# Patient Record
Sex: Male | Born: 1995 | Race: White | Hispanic: No | Marital: Single | Smoking: Never smoker
Health system: Southern US, Community
[De-identification: ages and names within clinical notes are randomized; demographics above are authoritative.]

## PROBLEM LIST (undated history)

## (undated) DIAGNOSIS — T7840XA Allergy, unspecified, initial encounter: Secondary | ICD-10-CM

## (undated) HISTORY — PX: CORONARY ARTERY BYPASS GRAFT: SHX141

## (undated) HISTORY — DX: Allergy, unspecified, initial encounter: T78.40XA

---
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
## 2017-03-23 ENCOUNTER — Telehealth: Payer: Self-pay | Admitting: Medical

## 2017-03-23 NOTE — Telephone Encounter (Signed)
Pt's mother dropped off immunization form for pt, document was given to [facility](Jasmine) pt is needing form by tomorrow

## 2017-04-01 ENCOUNTER — Telehealth: Payer: Self-pay | Admitting: Medical

## 2017-04-01 NOTE — Telephone Encounter (Signed)
Pt mother brought in document to be filled out for school, pt's mother was informed to pick up document today at 4 or 4:30. Document given to Jazmine.

## 2017-04-01 NOTE — Telephone Encounter (Signed)
Called patient to discuss.  Pt stated that he has a copy of the paperwork and could drop it off today around 2:45pm.  He said he would schedule his physical exam whenever he drops off paperwork.  Pt stated he could not talk longer, that he had to get back to work.  No additional needs or concerns voiced at this time.    2:45p time slot blocked to allot time to complete paperwork.

## 2017-04-01 NOTE — Telephone Encounter (Signed)
I last saw form when I gave it to you asking when did  I last see pt. I remember it seemed quite a long time and I did not remember seeing him any time recently.. Note in epic states we got t on 03-23-2017. And note states they needed it by 03-24-2017??  I likely gave you the form since I had just got back from vacation and was very busy not having time to fill out form but wanting more info. I have not seen form this week. So if we can't find ask pt or mom did they make copy. If they need form filled out today. Have them bring it in by 2:45. Block that slot for me.

## 2017-04-01 NOTE — Telephone Encounter (Signed)
Spoke with pt. Pt states he will not be able to get off work in time to pick up paper. Pt scheduled appointment for Monday.

## 2017-04-01 NOTE — Telephone Encounter (Signed)
Looking at his form he needs tdap booster and mmr titer. Looks like 2nd mmr never done. He stated in prior visit all vaccines done in state of [place]. Maybe someone did not put vaccine in data base. Titer option to prove he had two vaccines and immunity achieved.  Recommended meningococal conjugage vaccine.  Also he can get ppd or tb gold test.  Call pt and see if he can get here by 4:30

## 2017-04-01 NOTE — Telephone Encounter (Signed)
Pt's mom called in to follow up on paperwork. CMA stated that she will check with PCP and follow back up to assist.

## 2017-04-04 ENCOUNTER — Ambulatory Visit (INDEPENDENT_AMBULATORY_CARE_PROVIDER_SITE_OTHER): Payer: BLUE CROSS/BLUE SHIELD | Admitting: Medical

## 2017-04-04 ENCOUNTER — Encounter: Payer: Self-pay | Admitting: Medical

## 2017-04-04 VITALS — BP 135/80 | HR 68 | Temp 100.0°F | Ht 72.0 in | Wt 297.6 lb

## 2017-04-04 DIAGNOSIS — R7612 Nonspecific reaction to cell mediated immunity measurement of gamma interferon antigen response without active tuberculosis: Secondary | ICD-10-CM | POA: Diagnosis not present

## 2017-04-04 DIAGNOSIS — Z789 Other specified health status: Secondary | ICD-10-CM

## 2017-04-04 DIAGNOSIS — Z23 Encounter for immunization: Secondary | ICD-10-CM

## 2017-04-04 DIAGNOSIS — Z111 Encounter for screening for respiratory tuberculosis: Secondary | ICD-10-CM

## 2017-04-04 NOTE — Progress Notes (Signed)
   Subjective:    Patient ID: Dale Schmidt, male    DOB: 31-Dec-1995, 21 y.o.   MRN: [id number]  HPI  Pt in for forms to be filled out for school.  Pt needs tdap. By vaccine review never got 2nd mmr per state data base. Or maybe got but  never placed in data base. Meningitis vaccine recommended. Also would recommend TB screening test. Either blood or ppd.  Pt going to school mid August.  On review no acute illness and 4-5 years since out of country. Last visited [place] at that time.   Review of Systems  Constitutional: Negative for chills, fatigue and fever.  HENT: Negative for congestion, dental problem, drooling, hearing loss, sinus pain and sinus pressure.   Respiratory: Negative for cough, chest tightness, shortness of breath and wheezing.   Cardiovascular: Negative for chest pain and palpitations.  Gastrointestinal: Negative for abdominal pain, constipation, diarrhea, nausea and vomiting.  Genitourinary: Negative for difficulty urinating, enuresis, flank pain, genital sores, hematuria and scrotal swelling.  Musculoskeletal: Negative for back pain.  Skin: Negative for rash.  Neurological: Negative for dizziness, speech difficulty, weakness and headaches.  Hematological: Negative for adenopathy. Does not bruise/bleed easily.  Psychiatric/Behavioral: Negative for behavioral problems, confusion and suicidal ideas. The patient is not nervous/anxious.    Past Medical History:  Diagnosis Date  . Allergy      Social History   Social History  . Marital status: Single    Spouse name: N/A  . Number of children: N/A  . Years of education: N/A   Occupational History  . Not on file.   Social History Main Topics  . Smoking status: Never Smoker  . Smokeless tobacco: Never Used  . Alcohol use No  . Drug use: No  . Sexual activity: No   Other Topics Concern  . Not on file   Social History Narrative  . No narrative on file    No past surgical history on file.  Family  History  Problem Relation Age of Onset  . Hyperlipidemia Father     No Known Allergies  No current outpatient prescriptions on file prior to visit.   No current facility-administered medications on file prior to visit.     BP 135/80   Pulse 68   Temp 100 F (37.8 C)   Ht 6' (1.829 m)   Wt 297 lb 9.6 oz (135 kg)   BMI 40.36 kg/m       Objective:   Physical Exam  General Mental Status- Alert. General Appearance- Not in acute distress.   Skin General: Color- Normal Color. Moisture- Normal Moisture.  . Chest and Lung Exam Auscultation: Breath Sounds:-Normal.  Cardiovascular Auscultation:Rythm- Regular. Murmurs & Other Heart Sounds:Auscultation of the heart reveals- No Murmurs.  Abdomen Inspection:-Inspeection Normal. Palpation/Percussion:Note:No mass. Palpation and Percussion of the abdomen reveal- Non Tender, Non Distended + BS, no rebound or guarding.   Neurologic Cranial Nerve exam:- CN III-XII intact(No nystagmus), symmetric smile. Strength:- 5/5 equal and symmetric strength both upper and lower extremities.      Assessment & Plan:  For your college form and general health will get tdap vaccine and meningitis vaccine.  Will also get mmr titer and tb blood test.  Will follow lab results and then fill out form. Then advise you to come by and pick up forms.  Follow up as needed  Oluwaseyi Tull, Percell Miller, PA-C

## 2017-04-04 NOTE — Patient Instructions (Addendum)
For your college form and general health will get tdap vaccine and meningitis vaccine.  Will also get mmr titer and and tb blood test  Will follow lab results and then fill out form. Then advise you to come by and pick up forms.  Follow up as needed

## 2017-04-04 NOTE — Addendum Note (Signed)
Addended by: Orlene OchRENCE, Darrel Gloss N on: 04/04/2017 01:39 PM   Modules accepted: Orders

## 2017-04-05 LAB — MEASLES/MUMPS/RUBELLA IMMUNITY
Mumps IgG: 31.2 AU/mL — ABNORMAL HIGH (ref <9.00)
Rubella: 8.88 Index — ABNORMAL HIGH (ref <0.90)
Rubeola IgG: 113 AU/mL — ABNORMAL HIGH (ref <25.00)

## 2017-04-06 LAB — QUANTIFERON TB GOLD ASSAY (BLOOD)
Interferon Gamma Release Assay: NEGATIVE
MITOGEN-NIL SO: 9.41 [IU]/mL
Quantiferon Nil Value: 0.05 IU/mL
Quantiferon Tb Ag Minus Nil Value: <0 IU/mL

## 2017-04-07 ENCOUNTER — Telehealth: Payer: Self-pay | Admitting: Medical

## 2017-04-07 NOTE — Telephone Encounter (Signed)
Filled out form for vaccines. But would you fill out the meningitis vaccine portion and then he can pick up. Will you make copy of form so we can scan.

## 2017-04-11 NOTE — Telephone Encounter (Signed)
done

## 2017-04-18 ENCOUNTER — Telehealth: Payer: Self-pay | Admitting: Medical

## 2017-04-18 NOTE — Telephone Encounter (Signed)
Caller name: Domenic SchwabFaryal  Relation to pt: mother  Call back number: mother Jani Filestel [phone] Pharmacy:  Reason for call: Pt's mother wanting to have information about lab work with titer blood test done, pt is needing [facility] for [facility] [employer]. Pt's mother stated if possible to send a copy of his titer lab work to [facility] fax number [phone]. If pt does not have this lab work done before please let mother know ASAP so that pt can get orders and have it done for school. Please advise ASAP.

## 2017-04-19 ENCOUNTER — Telehealth: Payer: Self-pay | Admitting: Medical

## 2017-04-19 NOTE — Telephone Encounter (Signed)
I filled out pt school physical exam form. You can have mom pick up copy of pt mmr titer results. Or fax over the results if it is ok to do so.

## 2017-04-19 NOTE — Telephone Encounter (Signed)
See mom note sent to us by Royal HawthornJackelyn

## 2017-04-20 NOTE — Telephone Encounter (Signed)
Papers are un front awaiting pick up. Pt's mother was made aware.

## 2017-08-10 ENCOUNTER — Encounter: Payer: Self-pay | Admitting: Medical

## 2017-08-10 ENCOUNTER — Ambulatory Visit (INDEPENDENT_AMBULATORY_CARE_PROVIDER_SITE_OTHER): Payer: Medicaid Other | Admitting: Medical

## 2017-08-10 VITALS — BP 116/83 | HR 92 | Temp 98.3°F | Resp 16 | Ht 72.0 in | Wt 300.6 lb

## 2017-08-10 DIAGNOSIS — R05 Cough: Secondary | ICD-10-CM

## 2017-08-10 DIAGNOSIS — J029 Acute pharyngitis, unspecified: Secondary | ICD-10-CM | POA: Diagnosis not present

## 2017-08-10 DIAGNOSIS — R059 Cough, unspecified: Secondary | ICD-10-CM

## 2017-08-10 DIAGNOSIS — J40 Bronchitis, not specified as acute or chronic: Secondary | ICD-10-CM

## 2017-08-10 LAB — POCT RAPID STREP A (OFFICE): Rapid Strep A Screen: NEGATIVE

## 2017-08-10 MED ORDER — AZITHROMYCIN 250 MG PO TABS: ORAL_TABLET | ORAL | 0 refills | Status: DC

## 2017-08-10 MED ORDER — FLUTICASONE PROPIONATE 50 MCG/ACT NA SUSP: 2.0000 | Freq: Every day | NASAL | 1 refills | Status: DC

## 2017-08-10 NOTE — Progress Notes (Signed)
Subjective:    Patient ID: Dale Schmidt, male    DOB: 06/11/1996, 21 y.o.   MRN: [id number]  HPI  Pt in with cough, nasal congestion, runny nose and st. Pt states throat pain worse in morning. No fever, no chills or body aches.   Pt mom states he has been sick overall for about 2 weeks. Pt has not been wheezing. He has been coughing up mucous.   Denies sneezing early on.  Pt states various family have been sick.  Cough does not keep him up.   Review of Systems  Constitutional: Positive for fatigue. Negative for chills and fever.       Mild  HENT: Positive for congestion, sinus pressure, sinus pain and sore throat.   Respiratory: Positive for cough. Negative for chest tightness, shortness of breath and wheezing.   Cardiovascular: Negative for chest pain and palpitations.  Gastrointestinal: Negative for abdominal pain.  Genitourinary: Negative for dysuria and flank pain.  Musculoskeletal: Negative for back pain and myalgias.  Skin: Negative for rash.  Neurological: Negative for dizziness, seizures, syncope, weakness and headaches.  Hematological: Negative for adenopathy.  Psychiatric/Behavioral: Negative for agitation and confusion.   Past Medical History:  Diagnosis Date  . Allergy      Social History   Socioeconomic History  . Marital status: Single    Spouse name: Not on file  . Number of children: Not on file  . Years of education: Not on file  . Highest education level: Not on file  Social Needs  . Financial resource strain: Not on file  . Food insecurity - worry: Not on file  . Food insecurity - inability: Not on file  . Transportation needs - medical: Not on file  . Transportation needs - non-medical: Not on file  Occupational History  . Not on file  Tobacco Use  . Smoking status: Never Smoker  . Smokeless tobacco: Never Used  Substance and Sexual Activity  . Alcohol use: No    Alcohol/week: 0.0 oz  . Drug use: No  . Sexual activity: No  Other  Topics Concern  . Not on file  Social History Narrative  . Not on file    History reviewed. No pertinent surgical history.  Family History  Problem Relation Age of Onset  . Hyperlipidemia Father     No Known Allergies  No current outpatient medications on file prior to visit.   No current facility-administered medications on file prior to visit.     BP 116/83 (BP Location: Left Arm, Patient Position: Sitting, Cuff Size: Large)   Pulse 92   Temp 98.3 F (36.8 C) (Oral)   Resp 16   Ht 6' (1.829 m)   Wt (!) 300 lb 9.6 oz (136.4 kg)   SpO2 100%   BMI 40.77 kg/m       Objective:   Physical Exam  General  Mental Status - Alert. General Appearance - Well groomed. Not in acute distress.  Skin Rashes- No Rashes.  HEENT Head- Normal. Ear Auditory Canal - Left- Normal. Right - Normal.Tympanic Membrane- Left- Normal. Right- Normal. Eye Sclera/Conjunctiva- Left- Normal. Right- Normal. Nose & Sinuses Nasal Mucosa- Left-  Boggy and Congested. Right-  Boggy and  Congested.Bilateral no maxillary and no  frontal sinus pressure. Mouth & Throat Lips: Upper Lip- Normal: no dryness, cracking, pallor, cyanosis, or vesicular eruption. Lower Lip-Normal: no dryness, cracking, pallor, cyanosis or vesicular eruption. Buccal Mucosa- Bilateral- No Aphthous ulcers. Oropharynx- No Discharge or Erythema. Tonsils:  Characteristics- Bilateral- mild  Erythema .  Size/Enlargement- Bilateral- 1+ enlargement. Discharge- bilateral-None.  Neck Neck- Supple. No Masses.   Chest and Lung Exam Auscultation: Breath Sounds:-Clear even and unlabored.  Cardiovascular Auscultation:Rythm- Regular, rate and rhythm. Murmurs & Other Heart Sounds:Ausculatation of the heart reveal- No Murmurs.  Lymphatic Head & Neck General Head & Neck Lymphatics: Bilateral: Description- very faint enlarged submandibular nodes but not tender.  Abdomen Inspection:-Inspection Normal.  Palpation/Perucssion: Palpation  and Percussion of the abdomen reveal- Non Tender, No Rebound tenderness, No rigidity(Guarding) and No Palpable abdominal masses.  Liver:-Normal.  Spleen:- Normal/no splenomegaly.         Assessment & Plan:  For bronchitis symptoms and sore throat for 2 weeks will rx azithromycin antibiotic.   Rapid strep test was negative but also sending out culture.  For cough use benzonatate. For nasal congestion rx flonase.  If still symptomatic by Monday recommend cxr, cbc and mono studies.  Follow up in 7 days or as needed  Anielle Headrick, Ramon DredgeEdward, [employer] CorporationPA-C

## 2017-08-10 NOTE — Patient Instructions (Addendum)
For bronchitis symptoms and sore throat for 2 weeks will rx azithromycin antibiotic.   Rapid strep test was negative but also sending out culture.  For cough use benzonatate. For nasal congestion rx flonase.  If still symptomatic by Monday recommend cxr, cbc and mono studies.  Follow up in 7 days or as needed

## 2017-08-10 NOTE — Addendum Note (Signed)
Addended by: Orlene OchRENCE, Frederika Hukill N on: 08/10/2017 04:47 PM   Modules accepted: Orders

## 2017-08-11 LAB — CULTURE, GROUP A STREP
MICRO NUMBER: 81284047
SPECIMEN QUALITY:: ADEQUATE

## 2017-10-31 ENCOUNTER — Encounter (HOSPITAL_BASED_OUTPATIENT_CLINIC_OR_DEPARTMENT_OTHER): Payer: Self-pay | Admitting: *Deleted

## 2017-10-31 ENCOUNTER — Emergency Department (HOSPITAL_BASED_OUTPATIENT_CLINIC_OR_DEPARTMENT_OTHER)
Admission: EM | Admit: 2017-10-31 | Discharge: 2017-10-31 | Disposition: A | Discharge status: Home / Self Care | Payer: Medicaid Other | Attending: Emergency Medicine | Admitting: Emergency Medicine

## 2017-10-31 ENCOUNTER — Other Ambulatory Visit: Payer: Self-pay

## 2017-10-31 DIAGNOSIS — Z79899 Other long term (current) drug therapy: Secondary | ICD-10-CM | POA: Insufficient documentation

## 2017-10-31 DIAGNOSIS — R112 Nausea with vomiting, unspecified: Secondary | ICD-10-CM | POA: Diagnosis not present

## 2017-10-31 DIAGNOSIS — R197 Diarrhea, unspecified: Secondary | ICD-10-CM | POA: Insufficient documentation

## 2017-10-31 DIAGNOSIS — R1030 Lower abdominal pain, unspecified: Secondary | ICD-10-CM | POA: Diagnosis not present

## 2017-10-31 LAB — I-STAT CHEM 8, ED
BUN: 18 mg/dL (ref 6–20)
CHLORIDE: 105 mmol/L (ref 101–111)
Calcium, Ion: 1.1 mmol/L — ABNORMAL LOW (ref 1.15–1.40)
Creatinine, Ser: 0.9 mg/dL (ref 0.61–1.24)
Glucose, Bld: 109 mg/dL — ABNORMAL HIGH (ref 65–99)
HCT: 52 % (ref 39.0–52.0)
HEMOGLOBIN: 17.7 g/dL — AB (ref 13.0–17.0)
POTASSIUM: 3.8 mmol/L (ref 3.5–5.1)
Sodium: 143 mmol/L (ref 135–145)
TCO2: 25 mmol/L (ref 22–32)

## 2017-10-31 LAB — URINALYSIS, ROUTINE W REFLEX MICROSCOPIC
Bilirubin Urine: NEGATIVE
Glucose, UA: NEGATIVE mg/dL
KETONES UR: 15 mg/dL — AB
LEUKOCYTES UA: NEGATIVE
Nitrite: NEGATIVE
PROTEIN: NEGATIVE mg/dL
Specific Gravity, Urine: 1.025 (ref 1.005–1.030)
pH: 6 (ref 5.0–8.0)

## 2017-10-31 LAB — CBC WITH DIFFERENTIAL/PLATELET
BASOS ABS: 0 10*3/uL (ref 0.0–0.1)
Basophils Relative: 0 %
Eosinophils Absolute: 0 10*3/uL (ref 0.0–0.7)
Eosinophils Relative: 0 %
HCT: 50.8 % (ref 39.0–52.0)
HEMOGLOBIN: 18 g/dL — AB (ref 13.0–17.0)
LYMPHS ABS: 0.3 10*3/uL — AB (ref 0.7–4.0)
LYMPHS PCT: 4 %
MCH: 28.4 pg (ref 26.0–34.0)
MCHC: 35.4 g/dL (ref 30.0–36.0)
MCV: 80.3 fL (ref 78.0–100.0)
Monocytes Absolute: 0.3 10*3/uL (ref 0.1–1.0)
Monocytes Relative: 3 %
NEUTROS PCT: 93 %
Neutro Abs: 7.4 10*3/uL (ref 1.7–7.7)
PLATELETS: 133 10*3/uL — AB (ref 150–400)
RBC: 6.33 MIL/uL — ABNORMAL HIGH (ref 4.22–5.81)
RDW: 14.3 % (ref 11.5–15.5)
WBC: 8 10*3/uL (ref 4.0–10.5)

## 2017-10-31 LAB — HEPATIC FUNCTION PANEL
ALT: 33 U/L (ref 17–63)
AST: 31 U/L (ref 15–41)
Albumin: 4.4 g/dL (ref 3.5–5.0)
Alkaline Phosphatase: 87 U/L (ref 38–126)
Bilirubin, Direct: 0.2 mg/dL (ref 0.1–0.5)
Indirect Bilirubin: 0.8 mg/dL (ref 0.3–0.9)
Total Bilirubin: 1 mg/dL (ref 0.3–1.2)
Total Protein: 8.2 g/dL — ABNORMAL HIGH (ref 6.5–8.1)

## 2017-10-31 LAB — URINALYSIS, MICROSCOPIC (REFLEX): WBC, UA: NONE SEEN WBC/hpf (ref 0–5)

## 2017-10-31 LAB — LIPASE, BLOOD: Lipase: 21 U/L (ref 11–51)

## 2017-10-31 MED ORDER — ONDANSETRON 4 MG PO TBDP: 4.0000 mg | ORAL_TABLET | Freq: Three times a day (TID) | ORAL | 0 refills | Status: DC | PRN

## 2017-10-31 MED ORDER — ONDANSETRON HCL 4 MG/2ML IJ SOLN
4.0000 mg | Freq: Once | INTRAMUSCULAR | Status: AC | PRN
  Administered 2017-10-31: 4 mg via INTRAVENOUS
  Filled 2017-10-31: qty 2

## 2017-10-31 MED ORDER — SODIUM CHLORIDE 0.9 % IV BOLUS (SEPSIS)
1000.0000 mL | Freq: Once | INTRAVENOUS | Status: AC
  Administered 2017-10-31: 1000 mL via INTRAVENOUS

## 2017-10-31 MED ORDER — PANTOPRAZOLE SODIUM 40 MG IV SOLR
40.0000 mg | Freq: Once | INTRAVENOUS | Status: AC
  Administered 2017-10-31: 40 mg via INTRAVENOUS
  Filled 2017-10-31: qty 40

## 2017-10-31 NOTE — ED Notes (Signed)
Pt tolerating PO fluids

## 2017-10-31 NOTE — ED Notes (Signed)
Pt c/o n/v/d since 1400 today, mom had the same thing and was in the ED last night as a result

## 2017-10-31 NOTE — ED Triage Notes (Signed)
Pt c/o diffuse abd pain with n/v/d x 3 hrs

## 2017-10-31 NOTE — ED Provider Notes (Signed)
[facility] EMERGENCY DEPARTMENT Provider Note   CSN: [id number] Arrival date & time: 10/31/17  1703     History   Chief Complaint Chief Complaint  Patient presents with  . Abdominal Pain    HPI Dale Schmidt is a 22 y.o. male with no significant past medical history presents today with chief complaint acute onset, progressively improving abdominal pain with associated at around 2 PM today he began experiencing sudden onset of severe crampy lower abdominal pain.  Pain did not radiate.  He has had multiple episodes of nonbilious but bloody emesis.  He states that initial episode of emesis had bright red blood streaking and since then all following episodes of emesis have had coffee-ground appearance.  He has had 2 episodes of nonbloody watery stools.  He states that his mother presented to the ED with similar symptoms and was admitted for evaluation of a gastroenteritis and was unable to tolerate p.o. food and fluids in the ED.  He states that he was given fluids and Zofran prior to my assessment and states that his abdominal pain has significantly improved and he has had no more vomiting.  States that he is only had a meal from [employer] today but otherwise has not had any thing to eat or drink.  Yesterday he ate food at a [employer] party that his family was hosting which included chicken wings and pizza.  He states his mother thinks that her symptoms began after eating a 5-day expired yogurt and felafel.   The history is provided by the patient.    Past Medical History:  Diagnosis Date  . Allergy     There are no active problems to display for this patient.   History reviewed. No pertinent surgical history.     Home Medications    Prior to Admission medications   Medication Sig Start Date End Date Taking? Authorizing Provider  fluticasone (FLONASE) 50 MCG/ACT nasal spray Place 2 sprays daily into both nostrils. 08/10/17   Saguier, Ramon DredgeEdward, PA-C  ondansetron  (ZOFRAN ODT) 4 MG disintegrating tablet Take 1 tablet (4 mg total) by mouth every 8 (eight) hours as needed for nausea or vomiting. 10/31/17   Jeanie SewerFawze, Tamanika Heiney A, PA-C    Family History Family History  Problem Relation Age of Onset  . Hyperlipidemia Father     Social History Social History   Tobacco Use  . Smoking status: Never Smoker  . Smokeless tobacco: Never Used  Substance Use Topics  . Alcohol use: No    Alcohol/week: 0.0 oz  . Drug use: No     Allergies   Patient has no known allergies.   Review of Systems Review of Systems  Constitutional: Positive for fatigue. Negative for chills and fever.  Respiratory: Negative for shortness of breath.   Gastrointestinal: Positive for abdominal pain, diarrhea, nausea and vomiting. Negative for blood in stool and constipation.  Genitourinary: Negative for dysuria, frequency, hematuria and urgency.  Musculoskeletal: Negative for back pain.  All other systems reviewed and are negative.    Physical Exam Updated Vital Signs BP (!) 118/56   Pulse 99   Temp 99.1 F (37.3 C) (Oral)   Resp 16   Ht 6' (1.829 m)   Wt 136.1 kg (300 lb)   SpO2 96%   BMI 40.69 kg/m   Physical Exam  Constitutional: He appears well-developed and well-nourished.  Non-toxic appearance. He does not appear ill. No distress.  HENT:  Head: Normocephalic and atraumatic.  Eyes: Conjunctivae  are normal. Right eye exhibits no discharge. Left eye exhibits no discharge.  Neck: No JVD present. No tracheal deviation present.  Cardiovascular: Normal rate and regular rhythm.  Pulmonary/Chest: Effort normal and breath sounds normal.  Abdominal: Soft. Normal appearance. He exhibits no distension. Bowel sounds are increased. There is no tenderness. There is no rigidity, no rebound, no guarding, no CVA tenderness, no tenderness at McBurney's point and negative Murphy's sign.  Musculoskeletal: He exhibits no edema.  Neurological: He is alert.  Skin: Skin is warm and dry.  No erythema.  Psychiatric: He has a normal mood and affect. His behavior is normal.  Nursing note and vitals reviewed.    ED Treatments / Results  Labs (all labs ordered are listed, but only abnormal results are displayed) Labs Reviewed  URINALYSIS, ROUTINE W REFLEX MICROSCOPIC - Abnormal; Notable for the following components:      Result Value   APPearance HAZY (*)    Hgb urine dipstick TRACE (*)    Ketones, ur 15 (*)    All other components within normal limits  URINALYSIS, MICROSCOPIC (REFLEX) - Abnormal; Notable for the following components:   Bacteria, UA RARE (*)    Squamous Epithelial / LPF 0-5 (*)    All other components within normal limits  CBC WITH DIFFERENTIAL/PLATELET - Abnormal; Notable for the following components:   RBC 6.33 (*)    Hemoglobin 18.0 (*)    Platelets 133 (*)    Lymphs Abs 0.3 (*)    All other components within normal limits  HEPATIC FUNCTION PANEL - Abnormal; Notable for the following components:   Total Protein 8.2 (*)    All other components within normal limits  I-STAT CHEM 8, ED - Abnormal; Notable for the following components:   Glucose, Bld 109 (*)    Calcium, Ion 1.10 (*)    Hemoglobin 17.7 (*)    All other components within normal limits  GASTROINTESTINAL PANEL BY PCR, STOOL (REPLACES STOOL CULTURE)  LIPASE, BLOOD    EKG  EKG Interpretation None       Radiology No results found.  Procedures Procedures (including critical care time)  Medications Ordered in ED Medications  ondansetron (ZOFRAN) injection 4 mg (4 mg Intravenous Given 10/31/17 2124)  sodium chloride 0.9 % bolus 1,000 mL (0 mLs Intravenous Stopped 10/31/17 2206)  pantoprazole (PROTONIX) injection 40 mg (40 mg Intravenous Given 10/31/17 2256)     Initial Impression / Assessment and Plan / ED Course  I have reviewed the triage vital signs and the nursing notes.  Pertinent labs & imaging results that were available during my care of the patient were reviewed by me  and considered in my medical decision making (see chart for details).     Patient with acute onset, improving crampy abdominal pain with associated nausea, vomiting, and diarrhea which began earlier today.  Afebrile, initially tachycardic with significant improvement and resolution while in the ED.  He was given fluids and Zofran prior to my assessment and states that his symptoms have significantly improved.  His abdomen is nontender on my examination.  No leukocytosis, no significant electrolyte abnormalities, creatinine and LFTs within normal limits.  No elevation in his lipase.  UA is not concerning for UTI or pyelonephritis.  He was unable to give [place] a stool sample in the ED.  I doubt obstruction, perforation, appendicitis, AAA, or other acute surgical abdominal pathology.  Patient most likely has a gastroenteritis, likely viral in nature.  His mother displayed similar symptoms but  hers were more severe.  On reevaluation, patient is resting comfortably, tolerating p.o. food and fluids, and serial abdominal examinations remain unremarkable.  He is stable for discharge home with Zofran for nausea, advised patient to advance diet slowly.  He will follow-up with primary care physician for reevaluation of symptoms.  Discussed indications for return to the ED. Pt and patient's brother verbalized understanding of and agreement with plan and patient is safe for discharge home at this time. No complaints prior to discharge.  Final Clinical Impressions(s) / ED Diagnoses   Final diagnoses:  Lower abdominal pain  Nausea vomiting and diarrhea    ED Discharge Orders        Ordered    ondansetron (ZOFRAN ODT) 4 MG disintegrating tablet  Every 8 hours PRN     10/31/17 2345       Jeanie Sewer, PA-C 11/01/17 1316    Vanetta Mulders, MD 11/01/17 1524

## 2017-10-31 NOTE — Discharge Instructions (Signed)
1. Medications: zofran as needed for nausea and vomiting (wait around 20 minutes before having something to eat or drink), over-the-counter reflux medicines such as Tums or Pepcid or Prilosec, usual home medications 2. Treatment: rest, drink plenty of fluids, advance diet slowly drinking primarily clear fluids and broth for the first day and then moving onto bland foods that will not upset your stomach. 3. Follow Up: Please followup with your primary doctor in 3 days for discussion of your diagnoses and further evaluation after today's visit; if you do not have a primary care doctor use the resource guide provided to find one; Please return to the ER for persistent vomiting, high fevers or worsening symptoms

## 2018-04-26 ENCOUNTER — Encounter: Payer: Self-pay | Admitting: Family Medicine

## 2018-04-26 ENCOUNTER — Ambulatory Visit (INDEPENDENT_AMBULATORY_CARE_PROVIDER_SITE_OTHER): Payer: Medicaid Other | Admitting: Family Medicine

## 2018-04-26 VITALS — BP 122/80 | HR 84 | Temp 98.4°F | Resp 16 | Ht 73.0 in | Wt 295.0 lb

## 2018-04-26 DIAGNOSIS — M25511 Pain in right shoulder: Secondary | ICD-10-CM | POA: Diagnosis not present

## 2018-04-26 DIAGNOSIS — S46911A Strain of unspecified muscle, fascia and tendon at shoulder and upper arm level, right arm, initial encounter: Secondary | ICD-10-CM

## 2018-04-26 NOTE — Progress Notes (Signed)
  Subjective:     Patient ID: Dale Schmidt, male   DOB: [birth year], 22 y.o.   MRN: [id number]  HPI Patient presents to clinic c/o right shoulder pain for past 3 weeks. Believes pain is related to him throwing a football many times while playing football with friends 3 weeks ago. He did take a few doses of ibuprofen, but nothing taken consistently to help pain. Notices the pain the most when pressure is put on shoulder.   Social History   Tobacco Use  . Smoking status: Never Smoker  . Smokeless tobacco: Never Used  Substance Use Topics  . Alcohol use: No    Alcohol/week: 0.0 oz    Review of Systems  Constitutional: Negative for chills, fatigue and fever.  HENT: Negative.   Respiratory: Negative for cough and shortness of breath.   Cardiovascular: Negative for chest pain and palpitations.  Gastrointestinal: Negative.   Genitourinary: Negative.   Musculoskeletal: Positive for arthralgias and myalgias.       Right shoulder pain  Neurological: Negative for tremors, weakness and headaches.       Objective:   Physical Exam  Constitutional: He is oriented to person, place, and time. He appears well-developed and well-nourished. No distress.  HENT:  Head: Normocephalic and atraumatic.  Neck: Normal range of motion.  Cardiovascular: Normal rate and regular rhythm.  Pulmonary/Chest: Effort normal.  Musculoskeletal: Normal range of motion. He exhibits tenderness. He exhibits no deformity.       Arms: Able to reach right arm straight up above head, out to side, in front of body and backward by himself with minimal pain. Able to reach right arm across chest to touch left shoulder with minimal pain. Able to hold right arm out and resist my pushing down on arm.  ROM right elbow intact. ROM right hand/wrist intact.   Neurological: He is alert and oriented to person, place, and time.  Grips strength equal and strong.   Skin: Skin is warm and dry. No pallor.  Psychiatric: He has a normal  mood and affect. His behavior is normal. Thought content normal.  Nursing note and vitals reviewed.      Assessment:     1. Acute right shoulder pain  2. Right shoulder strain     Plan:       Take ibuprofen 600mg  with food BID consistently for next week to see if this helps pain. Also advised to do gentle stretching and ROM of right arm/shoulder to keep muscles screeched and avoidance of stiffness in shoulder joint.   Advised to call office in 1 week with progress update. If pain persists - can consider PT and/or possible imaging at that time.

## 2018-04-26 NOTE — Patient Instructions (Signed)
Wonderful to meet you!  Take 600mg  of advil (generic is ibuprofen) 2 times per day WITH FOOD consistently for the next week, then can go to taking as needed to see if this helps pain improve.  Can do gentle ROM exercises of upper arm and shoulder to keep joint from becoming stiff.   Call back in 1 week for update on progress of pain in shoulder.

## 2018-07-28 ENCOUNTER — Ambulatory Visit: Payer: Medicaid Other | Admitting: Family Medicine

## 2018-09-08 ENCOUNTER — Telehealth: Payer: Self-pay

## 2018-09-08 MED ORDER — OSELTAMIVIR PHOSPHATE 75 MG PO CAPS: 75.0000 mg | ORAL_CAPSULE | Freq: Two times a day (BID) | ORAL | 0 refills | Status: DC

## 2018-09-08 NOTE — Telephone Encounter (Signed)
Copied from CRM [phone]. Topic: General - Other >> Sep 08, 2018 12:40 PM Elliot GaultBell, [name] wrote: Relation to pt: father / [name]  Call back number: [phone] [id number] [place],  - [address]  Reason for call: Patient brother [name], Dale Schmidt was dx with the flu today by Dr. Drue NovelPaz. Patient requesting Tamiflu, please advise

## 2018-09-08 NOTE — Telephone Encounter (Signed)
Brother + for flu. Send tamiflu to pt pharmacy. Can start early onset flu illness.

## 2019-06-05 ENCOUNTER — Other Ambulatory Visit: Payer: Self-pay

## 2019-06-05 DIAGNOSIS — Z20822 Contact with and (suspected) exposure to covid-19: Secondary | ICD-10-CM

## 2019-06-07 LAB — NOVEL CORONAVIRUS, NAA: SARS-CoV-2, NAA: NOT DETECTED

## 2020-08-26 ENCOUNTER — Ambulatory Visit: Payer: Medicaid Other | Admitting: Medical

## 2020-08-26 ENCOUNTER — Ambulatory Visit (HOSPITAL_BASED_OUTPATIENT_CLINIC_OR_DEPARTMENT_OTHER)
Admission: RE | Admit: 2020-08-26 | Discharge: 2020-08-26 | Disposition: A | Discharge status: Home / Self Care | Payer: 59 | Source: Ambulatory Visit | Attending: Medical | Admitting: Medical

## 2020-08-26 ENCOUNTER — Other Ambulatory Visit: Payer: Self-pay

## 2020-08-26 VITALS — BP 127/86 | HR 68 | Resp 18 | Ht 73.0 in | Wt 268.0 lb

## 2020-08-26 DIAGNOSIS — M25512 Pain in left shoulder: Secondary | ICD-10-CM

## 2020-08-26 DIAGNOSIS — G8929 Other chronic pain: Secondary | ICD-10-CM

## 2020-08-26 DIAGNOSIS — S4992XA Unspecified injury of left shoulder and upper arm, initial encounter: Secondary | ICD-10-CM | POA: Diagnosis not present

## 2020-08-26 NOTE — Progress Notes (Signed)
Subjective:    Patient ID: Dale Schmidt, male    DOB: 02-Nov-1995, 24 y.o.   MRN: [id number]  HPI  Pt in for evaluation.  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 He went to [facility].  Pt in with left shoulder pain. 5 months ago he was playing football and bear hugged someone trying to tackle someone. He hurt his shoulder. Pt states has some pain on range of motion. Can't abduct are well.  He states can't lift weight/do shoulder exercises.    Review of Systems  Constitutional: Negative for chills, fatigue and fever.  Respiratory: Negative for cough, chest tightness, shortness of breath and wheezing.   Cardiovascular: Negative for chest pain and palpitations.  Gastrointestinal: Negative for abdominal pain.  Musculoskeletal:       Left shoulder pain.  Skin: Negative for rash.  Neurological: Negative for dizziness, weakness, numbness and headaches.  Hematological: Negative for adenopathy. Does not bruise/bleed easily.  Psychiatric/Behavioral: Negative for behavioral problems, confusion, hallucinations, sleep disturbance and suicidal ideas. The patient is not nervous/anxious.    Past Medical History:  Diagnosis Date   Allergy      Social History   Socioeconomic History   Marital status: Single    Spouse name: Not on file   Number of children: Not on file   Years of education: Not on file   Highest education level: Not on file  Occupational History   Not on file  Tobacco Use   Smoking status: Never Smoker   Smokeless tobacco: Never Used  Substance and Sexual Activity   Alcohol use: No    Alcohol/week: 0.0 standard drinks   Drug use: No   Sexual activity: Never  Other Topics Concern   Not on file  Social History Narrative   Not on file   Social Determinants of Health   Financial Resource Strain:    Difficulty of Paying Living Expenses: Not on file  Food Insecurity:    Worried About Running Out of Food in the Last Year: Not on file   Ran Out of Food  in the Last Year: Not on file  Transportation Needs:    Lack of Transportation (Medical): Not on file   Lack of Transportation (Non-Medical): Not on file  Physical Activity:    Days of Exercise per Week: Not on file   Minutes of Exercise per Session: Not on file  Stress:    Feeling of Stress : Not on file  Social Connections:    Frequency of Communication with Friends and Family: Not on file   Frequency of Social Gatherings with Friends and Family: Not on file   Attends Religious Services: Not on file   Active Member of Clubs or Organizations: Not on file   Attends Banker Meetings: Not on file   Marital Status: Not on file  Intimate Partner Violence:    Fear of Current or Ex-Partner: Not on file   Emotionally Abused: Not on file   Physically Abused: Not on file   Sexually Abused: Not on file    No past surgical history on file.  Family History  Problem Relation Age of Onset   Hyperlipidemia Father     No Known Allergies  Current Outpatient Medications on File Prior to Visit  Medication Sig Dispense Refill   oseltamivir (TAMIFLU) 75 MG capsule Take 1 capsule (75 mg total) by mouth 2 (two) times daily. (Patient not taking: Reported on 08/26/2020) 10 capsule 0   No current facility-administered medications  on file prior to visit.    BP 127/86    Pulse 68    Resp 18    Ht 6\' 1"  (1.854 m)    Wt 268 lb (121.6 kg)    SpO2 97%    BMI 35.36 kg/m      Objective:   Physical Exam  General Mental Status- Alert. General Appearance- Not in acute distress.   Skin General: Color- Normal Color. Moisture- Normal Moisture.  Neck Carotid Arteries- Normal color. Moisture- Normal Moisture. No carotid bruits. No JVD.  Chest and Lung Exam Auscultation: Breath Sounds:-Normal.  Cardiovascular Auscultation:Rythm- Regular. Murmurs & Other Heart Sounds:Auscultation of the heart reveals- No Murmurs.   Neurologic Cranial Nerve exam:- CN III-XII  intact(No nystagmus), symmetric smile. Strength:- 5/5 equal and symmetric strength both upper and lower extremities.  Left shoulder- pain on Rom. Can abduct arm above shoulder level.    Assessment & Plan:  For left shoulder pain 5 month with reduced range of motion will get xray of left shoulder. Also go ahead and refer to sports med due to length of injury. Evaluate rotator cuff.  Can use ibuprofen if needed.  Follow up in 7 days or as needed  , [employer]

## 2020-08-26 NOTE — Patient Instructions (Signed)
For left shoulder pain 5 month with reduced range of motion will get xray of left shoulder. Also go ahead and refer to sports med due to length of injury. Evaluate rotator cuff.  Can use ibuprofen if needed.  Follow up in 7 days or as needed

## 2020-09-16 ENCOUNTER — Ambulatory Visit: Payer: Self-pay

## 2020-09-16 ENCOUNTER — Ambulatory Visit (INDEPENDENT_AMBULATORY_CARE_PROVIDER_SITE_OTHER): Payer: 59 | Admitting: Family Medicine

## 2020-09-16 ENCOUNTER — Encounter: Payer: Self-pay | Admitting: Family Medicine

## 2020-09-16 ENCOUNTER — Other Ambulatory Visit: Payer: Self-pay

## 2020-09-16 VITALS — BP 120/82 | Ht 73.0 in | Wt 268.0 lb

## 2020-09-16 DIAGNOSIS — M778 Other enthesopathies, not elsewhere classified: Secondary | ICD-10-CM

## 2020-09-16 DIAGNOSIS — M25512 Pain in left shoulder: Secondary | ICD-10-CM

## 2020-09-16 DIAGNOSIS — G8929 Other chronic pain: Secondary | ICD-10-CM

## 2020-09-16 MED ORDER — PREDNISONE 5 MG PO TABS: ORAL_TABLET | ORAL | 0 refills | Status: DC

## 2020-09-16 NOTE — Patient Instructions (Signed)
Nice to meet you Please try heat  Please try the exercises   Please send me a message in MyChart with any questions or updates.  Please message me in 3 weeks to up date me and we'll take the next step after that.   --Dr. Jordan Likes

## 2020-09-16 NOTE — Assessment & Plan Note (Signed)
Initial injury was earlier this year. He feels more weak in the shoulder than anything else. Possible that he had a subluxation with the initial onset. The deltoid may be working more for stabilization as to why he feels the way he does. -Counseled on home exercise therapy and supportive care. -Prednisone. -Could consider further imaging or physical therapy.

## 2020-09-16 NOTE — Progress Notes (Signed)
  Dale Schmidt - 24 y.o. male MRN [id number]  Date of birth: Apr 16, 1996  SUBJECTIVE:  Including CC & ROS.  No chief complaint on file.   Dale Schmidt is a 24 y.o. male that is presenting with acute on chronic left shoulder pain. He was initially playing football and someone ran into his left shoulder. He had pain at the beginning and now has more weakness in the shoulder. He is unable to do his normal exercises. No prior history of surgery. Symptoms seem to be staying the same. Has not tried any medications..  Independent review of the left shoulder x-ray from 11/30 shows no acute abnormalities.   Review of Systems See HPI   HISTORY: Past Medical, Surgical, Social, and Family History Reviewed & Updated per EMR.   Pertinent Historical Findings include:  Past Medical History:  Diagnosis Date  . Allergy     No past surgical history on file.  Family History  Problem Relation Age of Onset  . Hyperlipidemia Father     Social History   Socioeconomic History  . Marital status: Single    Spouse name: Not on file  . Number of children: Not on file  . Years of education: Not on file  . Highest education level: Not on file  Occupational History  . Not on file  Tobacco Use  . Smoking status: Never Smoker  . Smokeless tobacco: Never Used  Substance and Sexual Activity  . Alcohol use: No    Alcohol/week: 0.0 standard drinks  . Drug use: No  . Sexual activity: Never  Other Topics Concern  . Not on file  Social History Narrative  . Not on file   Social Determinants of Health   Financial Resource Strain: Not on file  Food Insecurity: Not on file  Transportation Needs: Not on file  Physical Activity: Not on file  Stress: Not on file  Social Connections: Not on file  Intimate Partner Violence: Not on file     PHYSICAL EXAM:  VS: BP 120/82   Ht 6\' 1"  (1.854 m)   Wt 268 lb (121.6 kg)   BMI 35.36 kg/m  Physical Exam Gen: NAD, alert, cooperative with exam,  well-appearing MSK:  Left shoulder: Normal range of motion. Normal strength resistance. Normal active can test. Normal speeds test. Normal O'Brien's test. Neurovascular intact  Limited ultrasound: Left shoulder:  Normal-appearing biceps tendon. Normal-appearing subscapularis. Normal-appearing supraspinatus and static and dynamic testing. Normal-appearing posterior glenohumeral joint. The deltoid appears to have hyperemia throughout the mid belly in the anterior and lateral leaflets  Summary: Nonspecific hyperemia through the deltoid different views.  Ultrasound and interpretation by , MD    ASSESSMENT & PLAN:   Deltoid tendonitis, left Initial injury was earlier this year. He feels more weak in the shoulder than anything else. Possible that he had a subluxation with the initial onset. The deltoid may be working more for stabilization as to why he feels the way he does. -Counseled on home exercise therapy and supportive care. -Prednisone. -Could consider further imaging or physical therapy.

## 2020-10-04 DIAGNOSIS — U071 COVID-19: Secondary | ICD-10-CM | POA: Diagnosis not present

## 2020-10-04 DIAGNOSIS — R059 Cough, unspecified: Secondary | ICD-10-CM | POA: Diagnosis not present

## 2021-06-14 ENCOUNTER — Other Ambulatory Visit: Payer: Self-pay

## 2021-06-14 ENCOUNTER — Encounter (HOSPITAL_BASED_OUTPATIENT_CLINIC_OR_DEPARTMENT_OTHER): Payer: Self-pay | Admitting: *Deleted

## 2021-06-14 ENCOUNTER — Emergency Department (HOSPITAL_BASED_OUTPATIENT_CLINIC_OR_DEPARTMENT_OTHER): Payer: 59

## 2021-06-14 ENCOUNTER — Emergency Department (HOSPITAL_BASED_OUTPATIENT_CLINIC_OR_DEPARTMENT_OTHER)
Admission: EM | Admit: 2021-06-14 | Discharge: 2021-06-14 | Disposition: A | Discharge status: Home / Self Care | Payer: 59 | Attending: Emergency Medicine | Admitting: Emergency Medicine

## 2021-06-14 DIAGNOSIS — Y9361 Activity, american tackle football: Secondary | ICD-10-CM | POA: Diagnosis not present

## 2021-06-14 DIAGNOSIS — S4991XA Unspecified injury of right shoulder and upper arm, initial encounter: Secondary | ICD-10-CM

## 2021-06-14 DIAGNOSIS — X509XXA Other and unspecified overexertion or strenuous movements or postures, initial encounter: Secondary | ICD-10-CM | POA: Insufficient documentation

## 2021-06-14 NOTE — ED Provider Notes (Signed)
[facility] EMERGENCY DEPARTMENT Provider Note   CSN: [id number] Arrival date & time: 06/14/21  1948     History Chief Complaint  Patient presents with   Shoulder Injury    Dale Schmidt is a 25 y.o. male.  Presents with right shoulder injury while playing football.  He says at the time he felt his right shoulder pop out of place and put it back in place.  He has pain to his right shoulder.  Pain is constant.  He has decreased range of motion cannot lift his arm all the way up.  He has no numbness or tingling in distal extremity.  He has no other painful areas.   Shoulder Injury Pertinent negatives include no chest pain, no abdominal pain, no headaches and no shortness of breath.      Past Medical History:  Diagnosis Date   Allergy     Patient Active Problem List   Diagnosis Date Noted   Deltoid tendonitis, left 09/16/2020    History reviewed. No pertinent surgical history.     Family History  Problem Relation Age of Onset   Hyperlipidemia Father     Social History   Tobacco Use   Smoking status: Never   Smokeless tobacco: Never  Vaping Use   Vaping Use: Never used  Substance Use Topics   Alcohol use: No    Alcohol/week: 0.0 standard drinks   Drug use: No    Home Medications Prior to Admission medications   Medication Sig Start Date End Date Taking? Authorizing Provider  oseltamivir (TAMIFLU) 75 MG capsule Take 1 capsule (75 mg total) by mouth 2 (two) times daily. Patient not taking: Reported on 08/26/2020 09/08/18   Saguier, Ramon Dredge, PA-C  predniSONE (DELTASONE) 5 MG tablet Take 6 pills for first day, 5 pills second day, 4 pills third day, 3 pills fourth day, 2 pills the fifth day, and 1 pill sixth day. 09/16/20   Myra Rude, MD    Allergies    Patient has no known allergies.  Review of Systems   Review of Systems  Constitutional:  Negative for chills and fever.  HENT:  Negative for congestion and rhinorrhea.   Eyes:  Negative  for visual disturbance.  Respiratory:  Negative for cough, chest tightness and shortness of breath.   Cardiovascular:  Negative for chest pain, palpitations and leg swelling.  Gastrointestinal:  Negative for abdominal pain, constipation, diarrhea, nausea and vomiting.  Genitourinary:  Negative for difficulty urinating.  Musculoskeletal:  Positive for arthralgias. Negative for back pain.  Skin:  Negative for rash and wound.  Neurological:  Negative for dizziness, syncope, weakness, light-headedness, numbness and headaches.  All other systems reviewed and are negative.  Physical Exam Updated Vital Signs BP 120/88   Pulse 98   Temp 98.4 F (36.9 C) (Oral)   Resp 18   Ht 6\' 2"  (1.88 m)   Wt 120.2 kg   SpO2 99%   BMI 34.02 kg/m   Physical Exam Vitals and nursing note reviewed.  Constitutional:      General: He is not in acute distress.    Appearance: Normal appearance. He is not ill-appearing, toxic-appearing or diaphoretic.  HENT:     Head: Normocephalic and atraumatic.  Eyes:     General: No scleral icterus.       Right eye: No discharge.        Left eye: No discharge.     Conjunctiva/sclera: Conjunctivae normal.  Cardiovascular:     Pulses:  Normal pulses.          Radial pulses are 2+ on the right side and 2+ on the left side.  Pulmonary:     Effort: Pulmonary effort is normal. No respiratory distress.  Musculoskeletal:     Right shoulder: Tenderness present. No swelling, deformity or bony tenderness. Decreased range of motion. Normal strength. Normal pulse.     Left shoulder: No swelling or deformity.  Skin:    General: Skin is warm and dry.  Neurological:     Mental Status: He is alert.     Sensory: Sensation is intact.  Psychiatric:        Mood and Affect: Mood normal.        Behavior: Behavior normal.    ED Results / Procedures / Treatments   Labs (all labs ordered are listed, but only abnormal results are displayed) Labs Reviewed - No data to  display  EKG None  Radiology DG Shoulder Right  Result Date: 06/14/2021 CLINICAL DATA:  Football injury today with limited movement of the right shoulder and pain. EXAM: RIGHT SHOULDER - 2+ VIEW COMPARISON:  None. FINDINGS: There is no evidence of fracture or dislocation. There is no evidence of arthropathy or other focal bone abnormality. Soft tissues are unremarkable. IMPRESSION: Negative. Electronically Signed   By: Burman Nieves M.D.   On: 06/14/2021 21:12    Procedures Procedures   Medications Ordered in ED Medications - No data to display  ED Course  I have reviewed the triage vital signs and the nursing notes.  Pertinent labs & imaging results that were available during my care of the patient were reviewed by me and considered in my medical decision making (see chart for details).  Clinical Course as of 06/15/21 0017  Wynelle Link Jun 14, 2021  2154 No dislocation or fracture. [GL]    Clinical Course User Index [GL] Zanasia Hickson, Finis Bud, PA-C   MDM Rules/Calculators/A&P                         Patient is well-appearing.  Vital signs are stable. X-ray with no dislocation or fracture.  No evidence of neurovascular injury.  Patient is still does have limited range of motion.  Recommend that patient goes to orthopedic sports medicine provider for further work-up of possible soft tissue injury. Discussed with patient and he agrees with follow-up plan.  Final Clinical Impression(s) / ED Diagnoses Final diagnoses:  Injury of right shoulder, initial encounter    Rx / DC Orders ED Discharge Orders     None        Claudie Leach, PA-C 06/15/21 0017    Gwyneth Sprout, MD 06/16/21 1340

## 2021-06-14 NOTE — ED Triage Notes (Signed)
Pt reports right shoulder pain after playing football today

## 2021-06-14 NOTE — Discharge Instructions (Signed)
Your examination today is most concerning for a muscular injury 1. Medications: alternate ibuprofen and tylenol for pain control, take all usual home medications as they are prescribed 2. Treatment: rest, ice, elevate and use an ACE wrap or other compressive therapy to decrease swelling. Also drink plenty of fluids and do plenty of gentle stretching and move the affected muscle through its normal range of motion to prevent stiffness. 3. Follow Up: If your symptoms do not improve please follow up with orthopedics/sports medicine or your PCP for discussion of your diagnoses and further evaluation after today's visit; if you do not have a primary care doctor use the resource guide provided to find one; Please return to the ER for worsening symptoms or other concerns.   Please schedule an appointment with the sports medicine Dr. Clare Gandy to set up an appointment.

## 2021-06-15 ENCOUNTER — Telehealth: Payer: Self-pay

## 2021-06-15 NOTE — Telephone Encounter (Signed)
Transition Care Management Unsuccessful Follow-up Telephone Call  Date of discharge and from where:  06/14/2021-[facility]   Attempts:  1st Attempt  Reason for unsuccessful TCM follow-up call:  Left voice message    

## 2021-06-16 ENCOUNTER — Other Ambulatory Visit: Payer: Self-pay

## 2021-06-16 ENCOUNTER — Ambulatory Visit (INDEPENDENT_AMBULATORY_CARE_PROVIDER_SITE_OTHER): Payer: 59 | Admitting: Family Medicine

## 2021-06-16 VITALS — Ht 74.0 in | Wt 265.0 lb

## 2021-06-16 DIAGNOSIS — S43004A Unspecified dislocation of right shoulder joint, initial encounter: Secondary | ICD-10-CM

## 2021-06-16 MED ORDER — MELOXICAM 7.5 MG PO TABS: 7.5000 mg | ORAL_TABLET | Freq: Two times a day (BID) | ORAL | 1 refills | Status: DC | PRN

## 2021-06-16 NOTE — Progress Notes (Signed)
  Dale Schmidt - 25 y.o. male MRN [id number]  Date of birth: 1996/09/24  SUBJECTIVE:  Including CC & ROS.  No chief complaint on file.   Dale Schmidt is a 25 y.o. male that is presenting with right shoulder pain.  He was playing flag football and felt his shoulder dislocate.  He was able to reduce the shoulder himself.  No prior dislocation.  Having pain globally around the shoulder  Independent review of the right shoulder x-ray from 9/18 shows no acute changes.   Review of Systems See HPI   HISTORY: Past Medical, Surgical, Social, and Family History Reviewed & Updated per EMR.   Pertinent Historical Findings include:  Past Medical History:  Diagnosis Date   Allergy     No past surgical history on file.  Family History  Problem Relation Age of Onset   Hyperlipidemia Father     Social History   Socioeconomic History   Marital status: Single    Spouse name: Not on file   Number of children: Not on file   Years of education: Not on file   Highest education level: Not on file  Occupational History   Not on file  Tobacco Use   Smoking status: Never   Smokeless tobacco: Never  Vaping Use   Vaping Use: Never used  Substance and Sexual Activity   Alcohol use: No    Alcohol/week: 0.0 standard drinks   Drug use: No   Sexual activity: Never  Other Topics Concern   Not on file  Social History Narrative   Not on file   Social Determinants of Health   Financial Resource Strain: Not on file  Food Insecurity: Not on file  Transportation Needs: Not on file  Physical Activity: Not on file  Stress: Not on file  Social Connections: Not on file  Intimate Partner Violence: Not on file     PHYSICAL EXAM:  VS: Ht 6\' 2"  (1.88 m)   Wt 265 lb (120.2 kg)   BMI 34.02 kg/m  Physical Exam Gen: NAD, alert, cooperative with exam, well-appearing   ASSESSMENT & PLAN:   Shoulder dislocation, right, initial encounter Initial injury on 9/18.  No prior history of  dislocation.  Having pain globally around the shoulder joint. -Counseled on home exercise therapy and supportive care. -Sling and counseled on its use. -Mobic. -Can consider physical therapy going forward.

## 2021-06-16 NOTE — Patient Instructions (Signed)
Good to see you Please try heat before the exercises and ice after  Please use the mobic as needed  Please try the physical therapy   Please send me a message in MyChart with any questions or updates.  Please see me back in 4 weeks.   --Dr. Jordan Likes

## 2021-06-17 DIAGNOSIS — S43004A Unspecified dislocation of right shoulder joint, initial encounter: Secondary | ICD-10-CM | POA: Insufficient documentation

## 2021-06-17 NOTE — Assessment & Plan Note (Signed)
Initial injury on 9/18.  No prior history of dislocation.  Having pain globally around the shoulder joint. -Counseled on home exercise therapy and supportive care. -Sling and counseled on its use. -Mobic. -Can consider physical therapy going forward.

## 2021-06-18 NOTE — Telephone Encounter (Signed)
Transition Care Management Follow-up Telephone Call Date of discharge and from where: 06/14/2021 from [facility] How have you been since you were released from the hospital? Pt stated that he is feeling better and did not have any questions or concerns.  Any questions or concerns? No  Items Reviewed: Did the pt receive and understand the discharge instructions provided? Yes  Medications obtained and verified? Yes  Other? No  Any new allergies since your discharge? No  Dietary orders reviewed? No Do you have support at home? Yes   Functional Questionnaire: (I = Independent and D = Dependent) ADLs: I  Bathing/Dressing- I  Meal Prep- I  Eating- I  Maintaining continence- I  Transferring/Ambulation- I  Managing Meds- I   Follow up appointments reviewed:  PCP Hospital f/u appt confirmed? No   Specialist Hospital f/u appt confirmed? Yes  Pt followed up with sports med on 06/16/2021 Are transportation arrangements needed? No  If their condition worsens, is the pt aware to call PCP or go to the Emergency Dept.? Yes Was the patient provided with contact information for the PCP's office or ED? Yes Was to pt encouraged to call back with questions or concerns? Yes

## 2021-07-17 ENCOUNTER — Encounter: Payer: 59 | Admitting: Medical

## 2021-07-21 ENCOUNTER — Encounter: Payer: Self-pay | Admitting: Family Medicine

## 2021-07-21 ENCOUNTER — Ambulatory Visit (INDEPENDENT_AMBULATORY_CARE_PROVIDER_SITE_OTHER): Payer: 59 | Admitting: Family Medicine

## 2021-07-21 DIAGNOSIS — S43004A Unspecified dislocation of right shoulder joint, initial encounter: Secondary | ICD-10-CM | POA: Diagnosis not present

## 2021-07-21 NOTE — Progress Notes (Signed)
  Dale Schmidt - 25 y.o. male MRN [id number]  Date of birth: 07/30/1996  SUBJECTIVE:  Including CC & ROS.  No chief complaint on file.   Dale Schmidt is a 25 y.o. male that is following up for his right shoulder dislocation.  He has been doing well since his initial injury.  Still has limited range of motion but no pain.   Review of Systems See HPI   HISTORY: Past Medical, Surgical, Social, and Family History Reviewed & Updated per EMR.   Pertinent Historical Findings include:  Past Medical History:  Diagnosis Date   Allergy     History reviewed. No pertinent surgical history.  Family History  Problem Relation Age of Onset   Hyperlipidemia Father     Social History   Socioeconomic History   Marital status: Single    Spouse name: Not on file   Number of children: Not on file   Years of education: Not on file   Highest education level: Not on file  Occupational History   Not on file  Tobacco Use   Smoking status: Never   Smokeless tobacco: Never  Vaping Use   Vaping Use: Never used  Substance and Sexual Activity   Alcohol use: No    Alcohol/week: 0.0 standard drinks   Drug use: No   Sexual activity: Never  Other Topics Concern   Not on file  Social History Narrative   Not on file   Social Determinants of Health   Financial Resource Strain: Not on file  Food Insecurity: Not on file  Transportation Needs: Not on file  Physical Activity: Not on file  Stress: Not on file  Social Connections: Not on file  Intimate Partner Violence: Not on file     PHYSICAL EXAM:  VS: BP 130/80 (BP Location: Left Arm, Patient Position: Sitting)   Ht 6\' 2"  (1.88 m)   Wt 265 lb (120.2 kg)   BMI 34.02 kg/m  Physical Exam Gen: NAD, alert, cooperative with exam, well-appearing      ASSESSMENT & PLAN:   Shoulder dislocation, right, initial encounter Initial injury on 9/18.  Pain is improved but his external rotation abduction are still limited. -Counseled on home  exercise therapy and supportive care. -Could consider injection or further imaging.

## 2021-07-21 NOTE — Assessment & Plan Note (Signed)
Initial injury on 9/18.  Pain is improved but his external rotation abduction are still limited. -Counseled on home exercise therapy and supportive care. -Could consider injection or further imaging.

## 2021-07-21 NOTE — Patient Instructions (Signed)
Good to see you Please try heat before exercise and ice after  Please try the new exercises   Please send me a message in MyChart with any questions or updates.  Please see me back in 3 weeks or as needed if better.   --Dr. Jordan Likes

## 2021-11-02 ENCOUNTER — Ambulatory Visit: Payer: Medicaid Other | Admitting: Family Medicine

## 2021-11-02 ENCOUNTER — Ambulatory Visit: Payer: Self-pay

## 2021-11-02 VITALS — BP 110/80 | Ht 74.0 in | Wt 260.0 lb

## 2021-11-02 DIAGNOSIS — M222X2 Patellofemoral disorders, left knee: Secondary | ICD-10-CM | POA: Diagnosis not present

## 2021-11-02 DIAGNOSIS — M25562 Pain in left knee: Secondary | ICD-10-CM

## 2021-11-02 NOTE — Assessment & Plan Note (Signed)
Acutely occurring.  Pain is started in December.  There does appear to be changes at the lateral retinaculum. -Counseled on home exercise therapy and supportive care. -Shockwave therapy today. -Could consider physical therapy.

## 2021-11-02 NOTE — Patient Instructions (Signed)
Good to see you Please try the exercises   Please send me a message in MyChart with any questions or updates.  Please see me back to start shockwave therapy.   --Dr. Jordan Likes '

## 2021-11-02 NOTE — Progress Notes (Signed)
°  Dale Schmidt - 26 y.o. male MRN [id number]  Date of birth: [birth year]  SUBJECTIVE:  Including CC & ROS.  No chief complaint on file.   Dale Schmidt is a 26 y.o. male that is presenting with acute left knee pain.  The pain is over the lateral compartment.  The pain has been ongoing since December when he felt an injury when he was playing football.  No history of similar pain.  Intermittent in nature.  Has pain more with extension.   Review of Systems See HPI   HISTORY: Past Medical, Surgical, Social, and Family History Reviewed & Updated per EMR.   Pertinent Historical Findings include:  Past Medical History:  Diagnosis Date   Allergy     No past surgical history on file.   PHYSICAL EXAM:  VS: BP 110/80    Ht 6\' 2"  (1.88 m)    Wt 260 lb (117.9 kg)    BMI 33.38 kg/m  Physical Exam Gen: NAD, alert, cooperative with exam, well-appearing MSK:  Neurovascularly intact    Limited ultrasound: Left knee:  No effusion suprapatellar pouch. Normal-appearing quadricep and patellar tendon. Normal-appearing medial and lateral joint space There appears to be a disruption of the lateral retinaculum as it transitions over the lateral femoral condyle.  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  Ultrasound and interpretation by Clearance Coots, MD  ECSWT Note Dale Schmidt 10/15/1995  Procedure: ECSWT Indications: left knee pain   Procedure Details Consent: Risks of procedure as well as the alternatives and risks of each were explained to the (patient/caregiver).  Consent for procedure obtained. Time Out: Verified patient identification, verified procedure, site/side was marked, verified correct patient position, special equipment/implants available, medications/allergies/relevent history reviewed, required imaging and test results available.  Performed.  The area was cleaned with iodine and alcohol swabs.    The left knee was targeted for Extracorporeal  shockwave therapy.   Preset: Patellar tendinopathy Power Level: 90 Frequency: 12 Impulse/cycles: 2600 Head size: Medium Session: 1st  Patient did tolerate procedure well.   ASSESSMENT & PLAN:   Patellofemoral pain syndrome of left knee Acutely occurring.  Pain is started in December.  There does appear to be changes at the lateral retinaculum. -Counseled on home exercise therapy and supportive care. -Shockwave therapy today. -Could consider physical therapy.

## 2021-11-03 ENCOUNTER — Telehealth: Payer: Self-pay | Admitting: Family Medicine

## 2021-11-03 NOTE — Telephone Encounter (Signed)
Per Provider contact patient to schedule Shock wave therapy-- per patient he will have to call us when available to come in.-FYI --glh

## 2022-03-21 ENCOUNTER — Encounter: Payer: Self-pay | Admitting: Family Medicine

## 2022-04-09 IMAGING — CR DG SHOULDER 2+V*L*
3 series · 3 of 3 positions shown · non-contrast
Comparison: None.

CLINICAL DATA: Left shoulder pain for 1 month after football
injury.

EXAM:
LEFT SHOULDER - 2+ VIEW

[w shoulder grashey left]
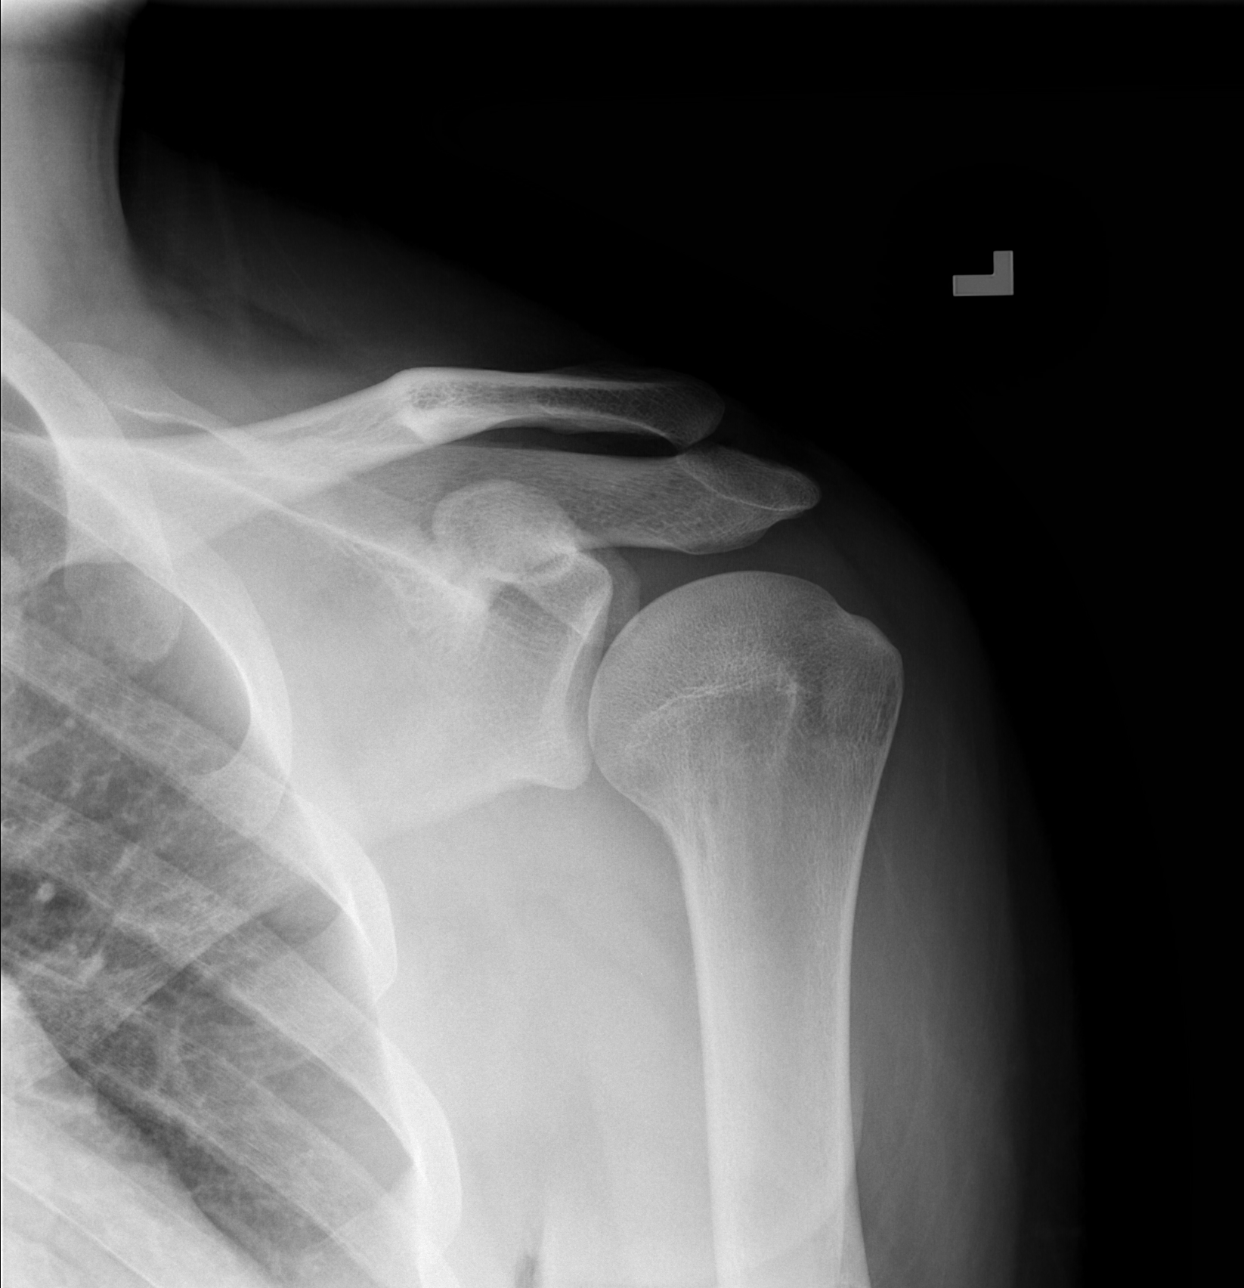

[w shoulder y view left]
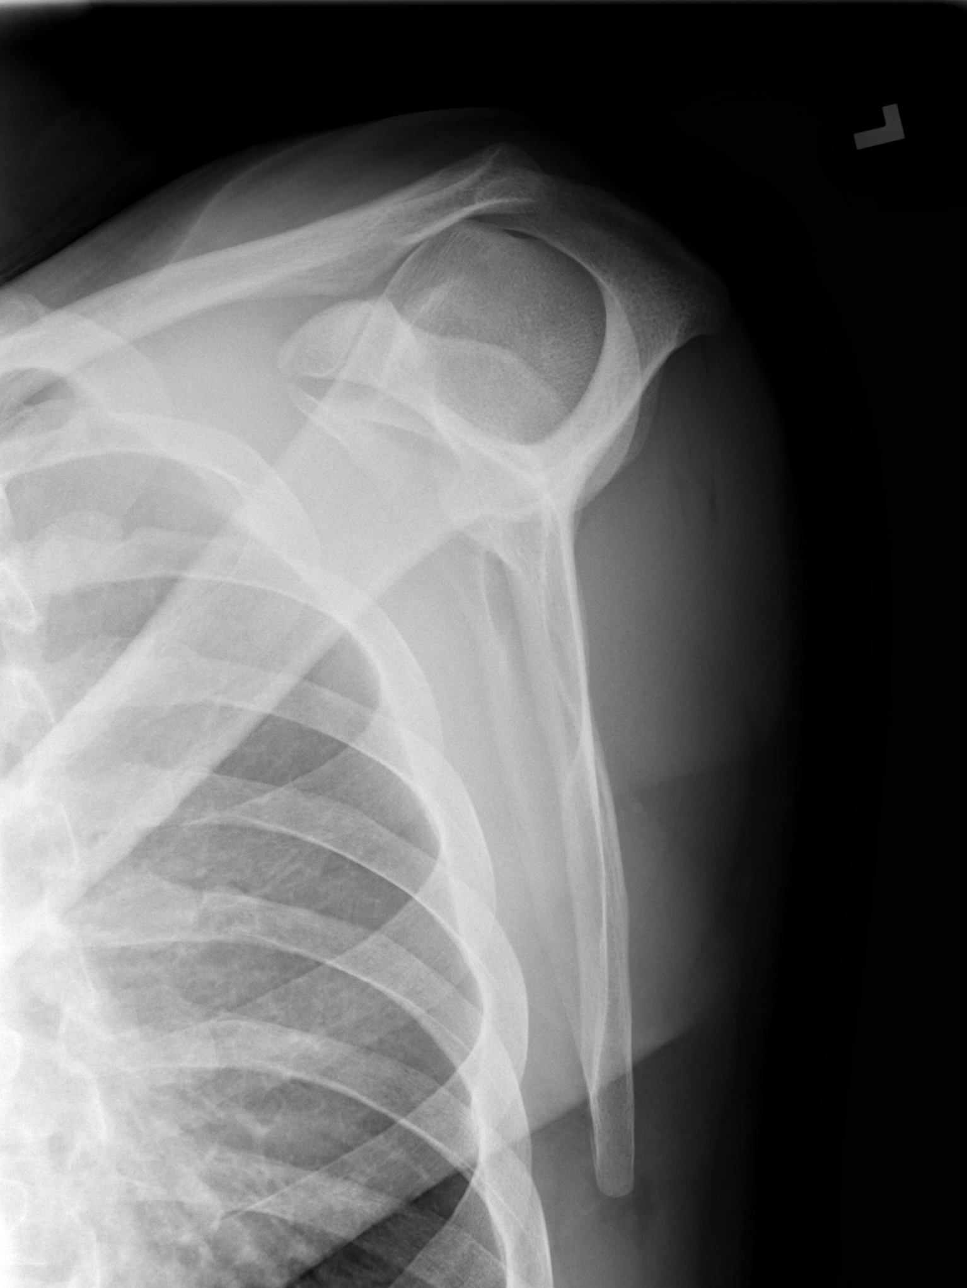

[x shoulder axillary left]
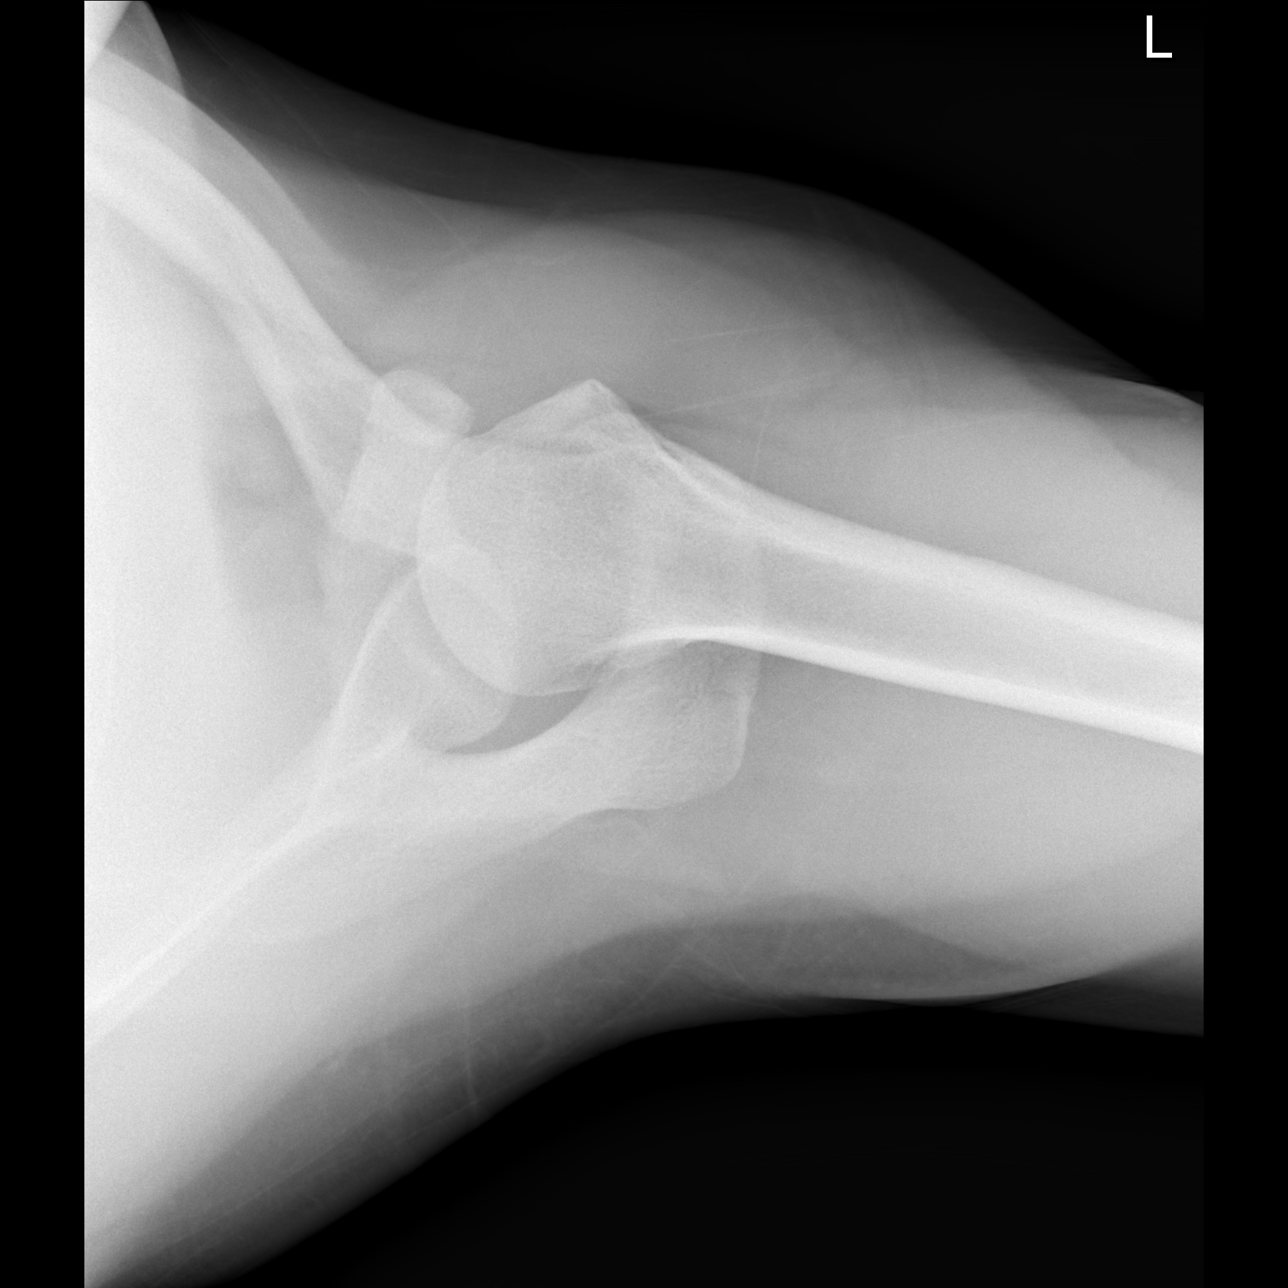

[3 of 3 positions shown; findings below may reference images not displayed]

FINDINGS: There is no evidence of fracture. The distal portion of the left
clavicle appears to be elevated relative to the acromion suggesting
some degree of joint separation. There is no evidence of arthropathy
or other focal bone abnormality. Soft tissues are unremarkable.
IMPRESSION: Probable mild left AC joint separation. No fracture is noted.

## 2022-04-28 ENCOUNTER — Ambulatory Visit: Payer: Medicaid Other | Admitting: Orthopaedic Surgery

## 2022-12-10 ENCOUNTER — Encounter: Payer: Medicaid Other | Admitting: Medical

## 2022-12-17 ENCOUNTER — Encounter: Payer: Self-pay | Admitting: Medical

## 2022-12-17 ENCOUNTER — Ambulatory Visit (INDEPENDENT_AMBULATORY_CARE_PROVIDER_SITE_OTHER): Payer: Medicaid Other | Admitting: Medical

## 2022-12-17 VITALS — BP 135/78 | HR 57 | Temp 98.2°F | Resp 18 | Ht 74.0 in | Wt 257.0 lb

## 2022-12-17 DIAGNOSIS — D751 Secondary polycythemia: Secondary | ICD-10-CM

## 2022-12-17 DIAGNOSIS — Z0000 Encounter for general adult medical examination without abnormal findings: Secondary | ICD-10-CM

## 2022-12-17 DIAGNOSIS — Z0001 Encounter for general adult medical examination with abnormal findings: Secondary | ICD-10-CM

## 2022-12-17 DIAGNOSIS — D72819 Decreased white blood cell count, unspecified: Secondary | ICD-10-CM

## 2022-12-17 NOTE — Addendum Note (Signed)
Addended by: Jeronimo Greaves on: 12/17/2022 02:59 PM   Modules accepted: Orders

## 2022-12-17 NOTE — Patient Instructions (Addendum)
For you wellness exam today I have ordered cbc, cmp and lipid.   Vaccines declined flu and hpv  Recommend exercise and healthy diet.  We will let you know lab results as they come in.  Follow up date appointment will be determined after lab review.      Preventive Care 39-25 Years Old, Male Preventive care refers to lifestyle choices and visits with your health care provider that can promote health and wellness. Preventive care visits are also called wellness exams. What can I expect for my preventive care visit? Counseling During your preventive care visit, your health care provider may ask about your: Medical history, including: Past medical problems. Family medical history. Current health, including: Emotional well-being. Home life and relationship well-being. Sexual activity. Lifestyle, including: Alcohol, nicotine or tobacco, and drug use. Access to firearms. Diet, exercise, and sleep habits. Safety issues such as seatbelt and bike helmet use. Sunscreen use. Work and work Statistician. Physical exam Your health care provider may check your: Height and weight. These may be used to calculate your BMI (body mass index). BMI is a measurement that tells if you are at a healthy weight. Waist circumference. This measures the distance around your waistline. This measurement also tells if you are at a healthy weight and may help predict your risk of certain diseases, such as type 2 diabetes and high blood pressure. Heart rate and blood pressure. Body temperature. Skin for abnormal spots. What immunizations do I need?  Vaccines are usually given at various ages, according to a schedule. Your health care provider will recommend vaccines for you based on your age, medical history, and lifestyle or other factors, such as travel or where you work. What tests do I need? Screening Your health care provider may recommend screening tests for certain conditions. This may include: Lipid  and cholesterol levels. Diabetes screening. This is done by checking your blood sugar (glucose) after you have not eaten for a while (fasting). Hepatitis B test. Hepatitis C test. HIV (human immunodeficiency virus) test. STI (sexually transmitted infection) testing, if you are at risk. Talk with your health care provider about your test results, treatment options, and if necessary, the need for more tests. Follow these instructions at home: Eating and drinking  Eat a healthy diet that includes fresh fruits and vegetables, whole grains, lean protein, and low-fat dairy products. Drink enough fluid to keep your urine pale yellow. Take vitamin and mineral supplements as recommended by your health care provider. Do not drink alcohol if your health care provider tells you not to drink. If you drink alcohol: Limit how much you have to 0-2 drinks a day. Know how much alcohol is in your drink. In the [place] one drink equals one 12 oz bottle of beer (355 mL), one 5 oz glass of wine (148 mL), or one 1 oz glass of hard liquor (44 mL). Lifestyle Brush your teeth every morning and night with fluoride toothpaste. Floss one time each day. Exercise for at least 30 minutes 5 or more days each week. Do not use any products that contain nicotine or tobacco. These products include cigarettes, chewing tobacco, and vaping devices, such as e-cigarettes. If you need help quitting, ask your health care provider. Do not use drugs. If you are sexually active, practice safe sex. Use a condom or other form of protection to prevent STIs. Find healthy ways to manage stress, such as: Meditation, yoga, or listening to music. Journaling. Talking to a trusted person. Spending time with friends  and family. Minimize exposure to UV radiation to reduce your risk of skin cancer. Safety Always wear your seat belt while driving or riding in a vehicle. Do not drive: If you have been drinking alcohol. Do not ride with someone  who has been drinking. If you have been using any mind-altering substances or drugs. While texting. When you are tired or distracted. Wear a helmet and other protective equipment during sports activities. If you have firearms in your house, make sure you follow all gun safety procedures. Seek help if you have been physically or sexually abused. What's next? Go to your health care provider once a year for an annual wellness visit. Ask your health care provider how often you should have your eyes and teeth checked. Stay up to date on all vaccines. This information is not intended to replace advice given to you by your health care provider. Make sure you discuss any questions you have with your health care provider. Document Revised: 03/11/2021 Document Reviewed: 03/11/2021 Elsevier Patient Education  [place].

## 2022-12-17 NOTE — Addendum Note (Signed)
Addended by: Anabel Halon on: 12/17/2022 02:30 PM   Modules accepted: Orders

## 2022-12-17 NOTE — Addendum Note (Signed)
Addended by: Manuela Schwartz on: 12/17/2022 03:26 PM   Modules accepted: Orders

## 2022-12-17 NOTE — Progress Notes (Signed)
   Subjective:    Patient ID: Dale Schmidt, male    DOB: 06-Nov-1995, 27 y.o.   MRN: [id number]  HPI  Pt in for cpe/wellness exam.  Working at [employer]. He is a Clinical research associate. Pt has exercising 4 days a week. Eating healthy. No alcohol and no smoking.  Flu vaccine declined/past season. Hpv vaccine declined.     Review of Systems  Constitutional:  Negative for chills, fatigue and fever.  HENT:  Negative for congestion, drooling, ear pain and postnasal drip.   Respiratory:  Negative for cough, chest tightness, shortness of breath and wheezing.   Cardiovascular:  Negative for chest pain and palpitations.  Gastrointestinal:  Negative for abdominal pain, blood in stool, diarrhea and rectal pain.  Genitourinary:  Negative for dysuria and flank pain.  Musculoskeletal:  Negative for back pain, joint swelling and neck stiffness.  Skin:  Negative for rash.  Neurological:  Negative for seizures, facial asymmetry and light-headedness.  Hematological:  Negative for adenopathy. Does not bruise/bleed easily.  Psychiatric/Behavioral:  Negative for behavioral problems and confusion.             Objective:   Physical Exam  General Mental Status- Alert. General Appearance- Not in acute distress.   Skin General: Color- Normal Color. Moisture- Normal Moisture.  Neck Carotid Arteries- Normal color. Moisture- Normal Moisture. No carotid bruits. No JVD.  Chest and Lung Exam Auscultation: Breath Sounds:-Normal.  Cardiovascular Auscultation:Rythm- Regular. Murmurs & Other Heart Sounds:Auscultation of the heart reveals- No Murmurs.  Abdomen Inspection:-Inspeection Normal. Palpation/Percussion:Note:No mass. Palpation and Percussion of the abdomen reveal- Non Tender, Non Distended + BS, no rebound or guarding.  Neurologic Cranial Nerve exam:- CN III-XII intact(No nystagmus), symmetric smile. Strength:- 5/5 equal and symmetric strength both upper and lower extremities.       Assessment &  Plan:  For you wellness exam today I have ordered cbc, cmp and lipid.   Vaccines declined flu and hpv  Recommend exercise and healthy diet.  We will let you know lab results as they come in.  Follow up date appointment will be determined after lab review.    Mackie Pai, PA-C

## 2022-12-18 LAB — LIPID PANEL
Cholesterol: 227 mg/dL — ABNORMAL HIGH (ref <200)
HDL: 70 mg/dL (ref >=40)
LDL Cholesterol (Calc): 142 mg/dL (calc) — ABNORMAL HIGH
Non-HDL Cholesterol (Calc): 157 mg/dL (calc) — ABNORMAL HIGH (ref <130)
Total CHOL/HDL Ratio: 3.2 (calc) (ref <5.0)
Triglycerides: 54 mg/dL (ref <150)

## 2022-12-18 LAB — COMPREHENSIVE METABOLIC PANEL
AG Ratio: 1.6 (calc) (ref 1.0–2.5)
ALT: 18 U/L (ref 9–46)
AST: 19 U/L (ref 10–40)
Albumin: 4.6 g/dL (ref 3.6–5.1)
Alkaline phosphatase (APISO): 64 U/L (ref 36–130)
BUN: 14 mg/dL (ref 7–25)
CO2: 27 mmol/L (ref 20–32)
Calcium: 9.3 mg/dL (ref 8.6–10.3)
Chloride: 103 mmol/L (ref 98–110)
Creat: 0.87 mg/dL (ref 0.60–1.24)
Globulin: 2.8 g/dL (calc) (ref 1.9–3.7)
Glucose, Bld: 94 mg/dL (ref 65–99)
Potassium: 4.5 mmol/L (ref 3.5–5.3)
Sodium: 139 mmol/L (ref 135–146)
Total Bilirubin: 0.7 mg/dL (ref 0.2–1.2)
Total Protein: 7.4 g/dL (ref 6.1–8.1)

## 2022-12-18 LAB — CBC WITH DIFFERENTIAL/PLATELET
Absolute Monocytes: 199 cells/uL — ABNORMAL LOW (ref 200–950)
Basophils Absolute: 20 cells/uL (ref 0–200)
Basophils Relative: 0.7 %
Eosinophils Absolute: 70 cells/uL (ref 15–500)
Eosinophils Relative: 2.5 %
HCT: 51.2 % — ABNORMAL HIGH (ref 38.5–50.0)
Hemoglobin: 17.5 g/dL — ABNORMAL HIGH (ref 13.2–17.1)
Lymphs Abs: 1355 cells/uL (ref 850–3900)
MCH: 29 pg (ref 27.0–33.0)
MCHC: 34.2 g/dL (ref 32.0–36.0)
MCV: 84.9 fL (ref 80.0–100.0)
MPV: 14.3 fL — ABNORMAL HIGH (ref 7.5–12.5)
Monocytes Relative: 7.1 %
Neutro Abs: 1156 cells/uL — ABNORMAL LOW (ref 1500–7800)
Neutrophils Relative %: 41.3 %
Platelets: 153 10*3/uL (ref 140–400)
RBC: 6.03 10*6/uL — ABNORMAL HIGH (ref 4.20–5.80)
RDW: 12.3 % (ref 11.0–15.0)
Total Lymphocyte: 48.4 %
WBC: 2.8 10*3/uL — ABNORMAL LOW (ref 3.8–10.8)

## 2022-12-19 NOTE — Addendum Note (Signed)
Addended by: Anabel Halon on: 12/19/2022 03:49 PM   Modules accepted: Orders

## 2022-12-24 ENCOUNTER — Inpatient Hospital Stay: Payer: Medicaid Other | Attending: Hematology & Oncology

## 2022-12-24 ENCOUNTER — Other Ambulatory Visit: Payer: Self-pay | Admitting: Family

## 2022-12-24 ENCOUNTER — Inpatient Hospital Stay: Payer: Medicaid Other

## 2022-12-24 ENCOUNTER — Inpatient Hospital Stay: Payer: Medicaid Other | Admitting: Family

## 2022-12-24 VITALS — BP 140/67 | HR 55 | Temp 97.8°F | Resp 16 | Ht 74.0 in | Wt 257.0 lb

## 2022-12-24 DIAGNOSIS — D751 Secondary polycythemia: Secondary | ICD-10-CM | POA: Insufficient documentation

## 2022-12-24 DIAGNOSIS — Z79899 Other long term (current) drug therapy: Secondary | ICD-10-CM | POA: Insufficient documentation

## 2022-12-24 DIAGNOSIS — D72819 Decreased white blood cell count, unspecified: Secondary | ICD-10-CM | POA: Diagnosis not present

## 2022-12-24 DIAGNOSIS — D565 Hemoglobin E-beta thalassemia: Secondary | ICD-10-CM

## 2022-12-24 DIAGNOSIS — Z8349 Family history of other endocrine, nutritional and metabolic diseases: Secondary | ICD-10-CM | POA: Insufficient documentation

## 2022-12-24 LAB — CMP (CANCER CENTER ONLY)
ALT: 15 U/L (ref 0–44)
AST: 19 U/L (ref 15–41)
Albumin: 4.6 g/dL (ref 3.5–5.0)
Alkaline Phosphatase: 65 U/L (ref 38–126)
Anion gap: 8 (ref 5–15)
BUN: 19 mg/dL (ref 6–20)
CO2: 26 mmol/L (ref 22–32)
Calcium: 9.3 mg/dL (ref 8.9–10.3)
Chloride: 105 mmol/L (ref 98–111)
Creatinine: 0.98 mg/dL (ref 0.61–1.24)
GFR, Estimated: >60 mL/min (ref >60)
Glucose, Bld: 105 mg/dL — ABNORMAL HIGH (ref 70–99)
Potassium: 4.3 mmol/L (ref 3.5–5.1)
Sodium: 139 mmol/L (ref 135–145)
Total Bilirubin: 0.6 mg/dL (ref 0.3–1.2)
Total Protein: 7 g/dL (ref 6.5–8.1)

## 2022-12-24 LAB — IRON AND IRON BINDING CAPACITY (CC-WL,HP ONLY)
Iron: 67 ug/dL (ref 45–182)
Saturation Ratios: 16 % — ABNORMAL LOW (ref 17.9–39.5)
TIBC: 419 ug/dL (ref 250–450)
UIBC: 352 ug/dL

## 2022-12-24 LAB — CBC WITH DIFFERENTIAL (CANCER CENTER ONLY)
Abs Immature Granulocytes: 0 10*3/uL (ref 0.00–0.07)
Basophils Absolute: 0 10*3/uL (ref 0.0–0.1)
Basophils Relative: 1 %
Eosinophils Absolute: 0.1 10*3/uL (ref 0.0–0.5)
Eosinophils Relative: 4 %
HCT: 51.2 % (ref 39.0–52.0)
Hemoglobin: 17.7 g/dL — ABNORMAL HIGH (ref 13.0–17.0)
Immature Granulocytes: 0 %
Lymphocytes Relative: 50 %
Lymphs Abs: 1.5 10*3/uL (ref 0.7–4.0)
MCH: 29.4 pg (ref 26.0–34.0)
MCHC: 34.6 g/dL (ref 30.0–36.0)
MCV: 85 fL (ref 80.0–100.0)
Monocytes Absolute: 0.3 10*3/uL (ref 0.1–1.0)
Monocytes Relative: 9 %
Neutro Abs: 1.1 10*3/uL — ABNORMAL LOW (ref 1.7–7.7)
Neutrophils Relative %: 36 %
Platelet Count: 130 10*3/uL — ABNORMAL LOW (ref 150–400)
RBC: 6.02 MIL/uL — ABNORMAL HIGH (ref 4.22–5.81)
RDW: 12.1 % (ref 11.5–15.5)
WBC Count: 3 10*3/uL — ABNORMAL LOW (ref 4.0–10.5)
nRBC: 0 % (ref 0.0–0.2)

## 2022-12-24 LAB — LACTATE DEHYDROGENASE: LDH: 150 U/L (ref 98–192)

## 2022-12-24 LAB — SAVE SMEAR(SSMR), FOR PROVIDER SLIDE REVIEW

## 2022-12-24 LAB — FERRITIN: Ferritin: 119 ng/mL (ref 24–336)

## 2022-12-24 NOTE — Progress Notes (Signed)
Hematology/Oncology Consultation   Name: Dale Schmidt      MRN: [id number]    Location: Room/bed info not found  Date: 12/24/2022 Time:10:53 AM   REFERRING PHYSICIAN: Mackie Pai, PA-C  REASON FOR CONSULT:  Leukopenia    DIAGNOSIS: Leukopenia   HISTORY OF PRESENT ILLNESS: Mr. Kumagai is a very pleasant 27 yo gentleman with middle [place] ancestry. 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  He states that his mother and sister also have leukopenia.  WBC count is 3.0, Hgb 17.7, Hct 51%, MCV 85 and platelets 130.  He denies fatigue.  No issue with frequent or recurrent infections.  No fever, chills, n/v, cough, rash, dizziness, SOB, chest pain, palpitations, abdominal pain or changes in bowel or bladder habits.  No hormone replacement use.  Only past surgical history was wisdom teeth extraction. No complications.  No personal or known familial history of cancer.  No history of diabetes or thyroid disease.  No swelling, tenderness, numbness or tingling in his extremities.  No falls or syncope reported.  Appetite and hydration are good. Weight is stable at 257 lbs.  He is quite active going to the gym. He uses pre work out and protein supplementations.  No smoking, ETOH or recreational drug use.  He currently works for [employer] in [employer].   ROS: All other 10 point review of systems is negative.   PAST MEDICAL HISTORY:   Past Medical History:  Diagnosis Date   Allergy     ALLERGIES: No Known Allergies    MEDICATIONS:  No current outpatient medications on file prior to visit.   No current facility-administered medications on file prior to visit.     PAST SURGICAL HISTORY No past surgical history on file.  FAMILY HISTORY: Family History  Problem Relation Age of Onset   Hyperlipidemia Father     SOCIAL HISTORY:  reports that he has never smoked. He has never used smokeless tobacco. He reports that he does  not drink alcohol and does not use drugs.  PERFORMANCE STATUS: The patient's performance status is 0 - Asymptomatic  PHYSICAL EXAM: Most Recent Vital Signs: Blood pressure (!) 140/67, pulse (!) 55, temperature 97.8 F (36.6 C), temperature source Oral, resp. rate 16, height 6\' 2"  (1.88 m), weight 257 lb (116.6 kg), SpO2 99 %. BP (!) 140/67 (BP Location: Right Arm, Patient Position: Sitting)   Pulse (!) 55   Temp 97.8 F (36.6 C) (Oral)   Resp 16   Ht 6\' 2"  (1.88 m)   Wt 257 lb (116.6 kg)   SpO2 99%   BMI 33.00 kg/m   General Appearance:    Alert, cooperative, no distress, appears stated age  Head:    Normocephalic, without obvious abnormality, atraumatic  Eyes:    PERRL, conjunctiva/corneas clear, EOM's intact, fundi    benign, both eyes             Throat:   Lips, mucosa, and tongue normal; teeth and gums normal  Neck:   Supple, symmetrical, trachea midline, no adenopathy;       thyroid:  No enlargement/tenderness/nodules; no carotid   bruit or JVD  Back:     Symmetric, no curvature, ROM normal, no CVA tenderness  Lungs:     Clear to auscultation bilaterally, respirations unlabored  Chest wall:    No tenderness or deformity  Heart:    Regular rate and rhythm, S1 and S2 normal, no murmur, rub  or gallop  Abdomen:     Soft, non-tender, bowel sounds active all four quadrants,    no masses, no organomegaly        Extremities:   Extremities normal, atraumatic, no cyanosis or edema  Pulses:   2+ and symmetric all extremities  Skin:   Skin color, texture, turgor normal, no rashes or lesions  Lymph nodes:   Cervical, supraclavicular, and axillary nodes normal  Neurologic:   CNII-XII intact. Normal strength, sensation and reflexes      throughout    LABORATORY DATA:  Results for orders placed or performed in visit on 12/24/22 (from the past 48 hour(s))  CBC with Differential ([facility] Only)     Status: Abnormal   Collection Time: 12/24/22 10:35 AM  Result Value Ref  Range   WBC Count 3.0 (L) 4.0 - 10.5 K/uL   RBC 6.02 (H) 4.22 - 5.81 MIL/uL   Hemoglobin 17.7 (H) 13.0 - 17.0 g/dL   HCT 51.2 39.0 - 52.0 %   MCV 85.0 80.0 - 100.0 fL   MCH 29.4 26.0 - 34.0 pg   MCHC 34.6 30.0 - 36.0 g/dL   RDW 12.1 11.5 - 15.5 %   Platelet Count 130 (L) 150 - 400 K/uL    Comment: REPEATED TO VERIFY   nRBC 0.0 0.0 - 0.2 %   Neutrophils Relative % 36 %   Neutro Abs 1.1 (L) 1.7 - 7.7 K/uL   Lymphocytes Relative 50 %   Lymphs Abs 1.5 0.7 - 4.0 K/uL   Monocytes Relative 9 %   Monocytes Absolute 0.3 0.1 - 1.0 K/uL   Eosinophils Relative 4 %   Eosinophils Absolute 0.1 0.0 - 0.5 K/uL   Basophils Relative 1 %   Basophils Absolute 0.0 0.0 - 0.1 K/uL   Immature Granulocytes 0 %   Abs Immature Granulocytes 0.00 0.00 - 0.07 K/uL    Comment: Performed at [facility] Lab at [facility], [address] [place], [place] [address]  Save Smear for Provider Slide Review     Status: None   Collection Time: 12/24/22 10:35 AM  Result Value Ref Range   Smear Review SMEAR STAINED AND AVAILABLE FOR REVIEW     Comment: Performed at [facility] Lab at [facility], [address] [place], [place] [address]      RADIOGRAPHY: No results found.     PATHOLOGY: None  ASSESSMENT/PLAN: Mr. Santanna is a very pleasant 27 yo gentleman with middle [place] ancestry with long history of leukopenia and erythrocytosis.  Health history of lab work reviewed with Dr. Marin Olp. Patient noted to have some large platelets and elevated RBC's. Lab work up is pending. We did add a JAK 2 and epo to his work up as well.  Follow-up pending results.   All questions were answered. The patient knows to call the clinic with any problems, questions or concerns. We can certainly see the patient much sooner if necessary.  The patient was discussed with Dr. Marin Olp and he is in agreement with the aforementioned.   Lottie Dawson, NP

## 2022-12-26 LAB — ERYTHROPOIETIN: Erythropoietin: 7 m[IU]/mL (ref 2.6–18.5)

## 2022-12-28 LAB — HGB FRACTIONATION CASCADE
Hgb A2: 2.5 % (ref 1.8–3.2)
Hgb A: 97.5 % (ref 96.4–98.8)
Hgb F: 0 % (ref 0.0–2.0)
Hgb S: 0 %

## 2023-01-05 ENCOUNTER — Telehealth: Payer: Self-pay | Admitting: *Deleted

## 2023-01-05 NOTE — Telephone Encounter (Signed)
Per 12/24/22 los - Work up pending results.

## 2023-01-07 ENCOUNTER — Other Ambulatory Visit: Payer: Self-pay | Admitting: Family

## 2023-01-07 DIAGNOSIS — D751 Secondary polycythemia: Secondary | ICD-10-CM

## 2023-01-07 DIAGNOSIS — D72819 Decreased white blood cell count, unspecified: Secondary | ICD-10-CM

## 2023-01-07 LAB — JAK 2 V617F (GENPATH)

## 2023-01-10 ENCOUNTER — Encounter: Payer: Self-pay | Admitting: *Deleted

## 2023-01-26 IMAGING — DX DG SHOULDER 2+V*R*
4 series · 4 of 4 positions shown · non-contrast
Comparison: None.

CLINICAL DATA: Football injury today with limited movement of the
right shoulder and pain.

EXAM:
RIGHT SHOULDER - 2+ VIEW

[shoulder grashey]
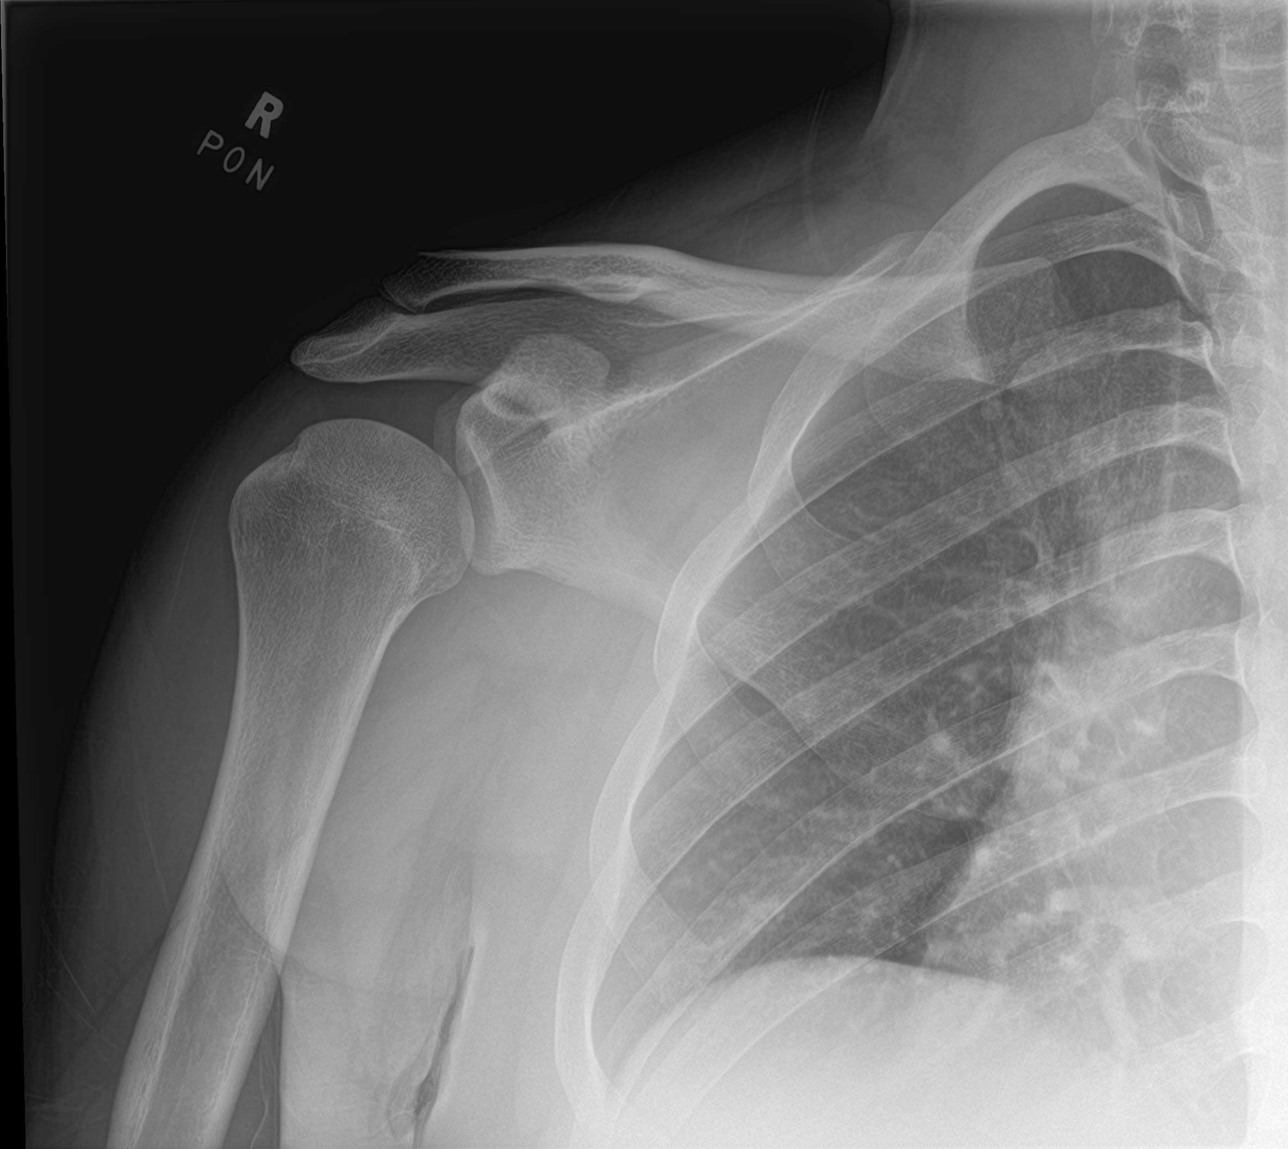

[shoulder y view]
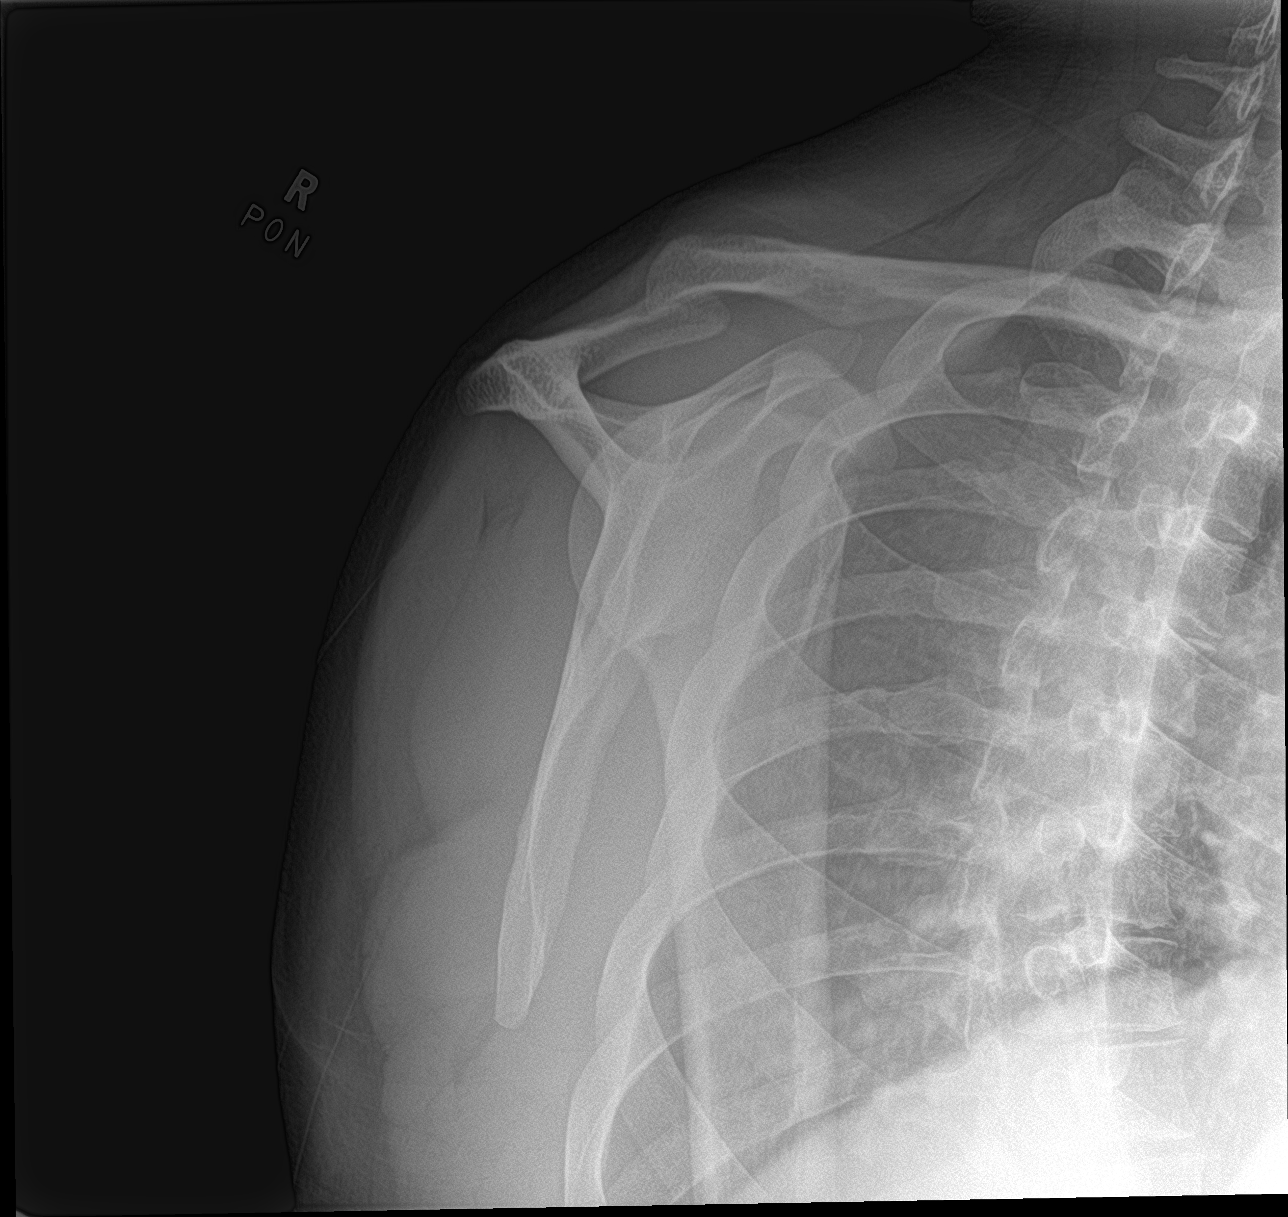

[shoulder axillary]
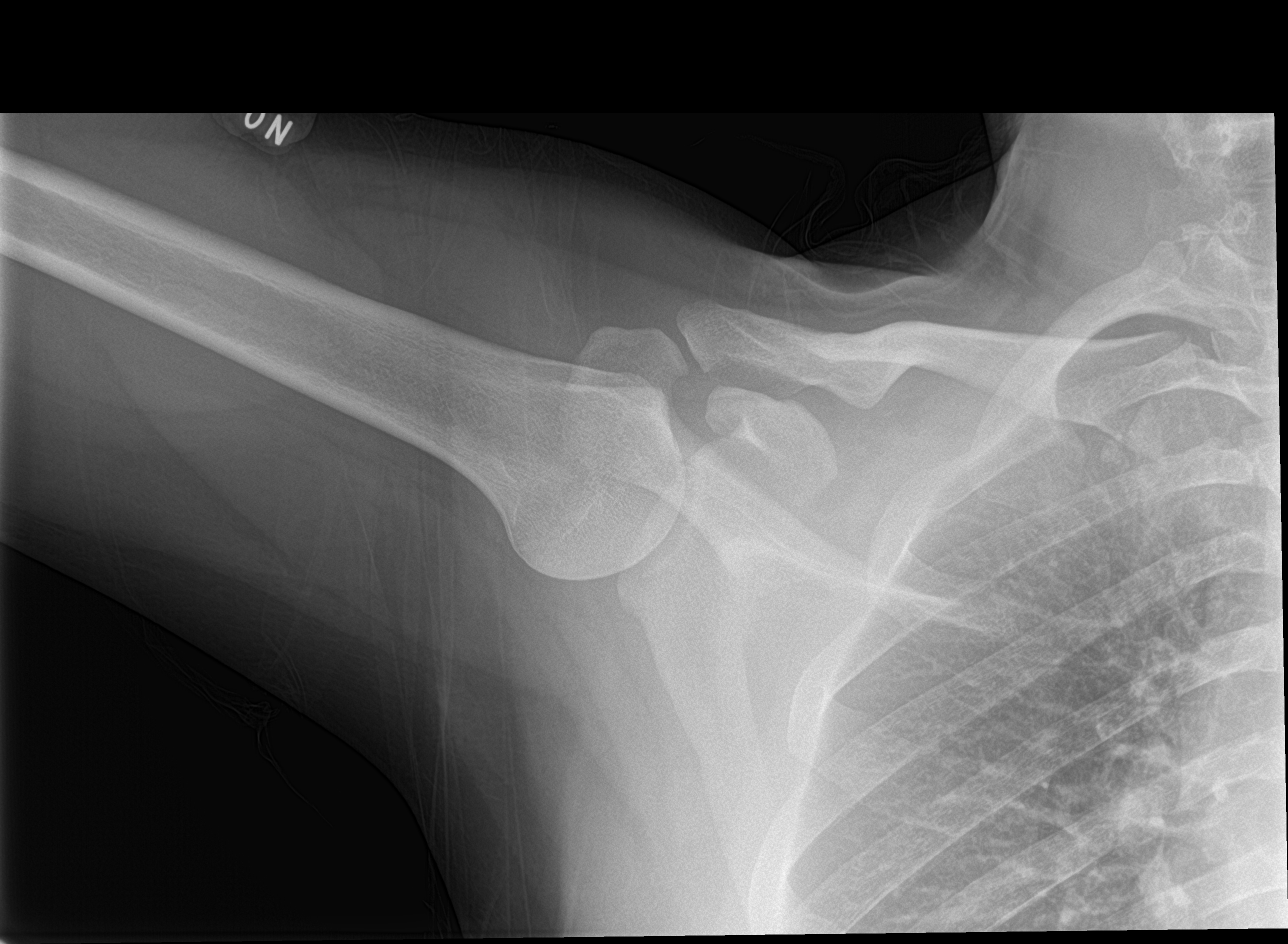

[shoulder ap neutral]
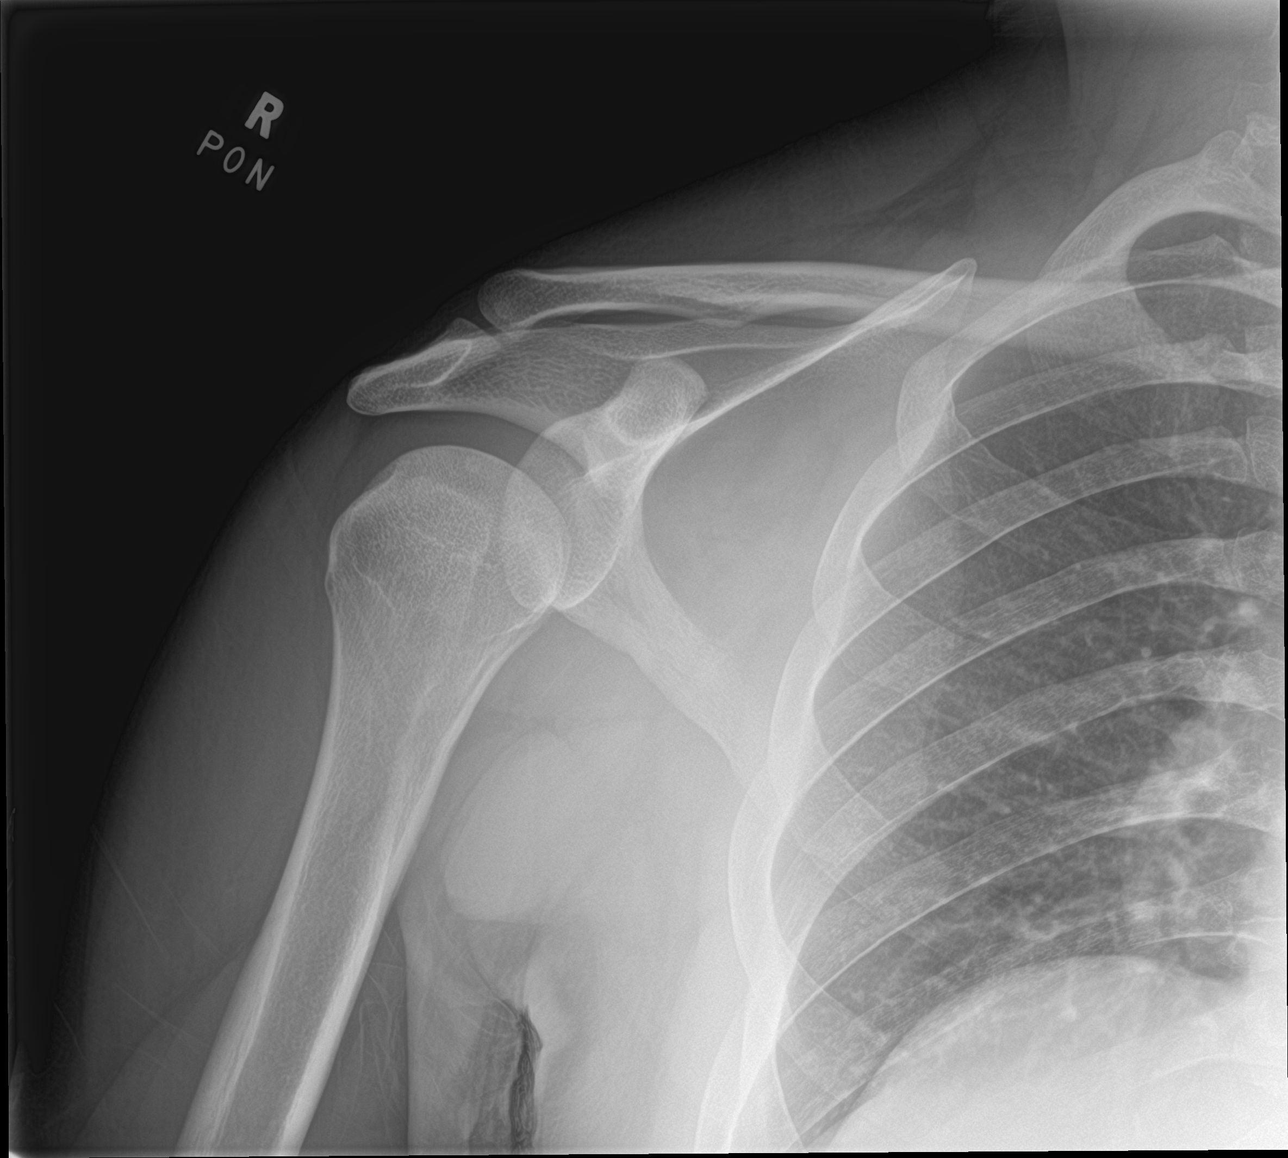

[4 of 4 positions shown; findings below may reference images not displayed]

FINDINGS: There is no evidence of fracture or dislocation. There is no
evidence of arthropathy or other focal bone abnormality. Soft
tissues are unremarkable.
IMPRESSION: Negative.

## 2023-05-11 ENCOUNTER — Inpatient Hospital Stay: Payer: 59 | Attending: Hematology & Oncology

## 2023-05-11 ENCOUNTER — Inpatient Hospital Stay: Payer: 59 | Admitting: Family
